# Patient Record
Sex: Female | Born: 1990 | Race: White | Hispanic: No | Marital: Single | Smoking: Never smoker
Health system: Southern US, Community
[De-identification: ages and names within clinical notes are randomized; demographics above are authoritative.]

## PROBLEM LIST (undated history)

## (undated) ENCOUNTER — Ambulatory Visit: Payer: No Typology Code available for payment source

## (undated) DIAGNOSIS — N809 Endometriosis, unspecified: Secondary | ICD-10-CM

## (undated) DIAGNOSIS — T8859XA Other complications of anesthesia, initial encounter: Secondary | ICD-10-CM

## (undated) DIAGNOSIS — K08409 Partial loss of teeth, unspecified cause, unspecified class: Secondary | ICD-10-CM

## (undated) DIAGNOSIS — T4145XA Adverse effect of unspecified anesthetic, initial encounter: Secondary | ICD-10-CM

## (undated) HISTORY — DX: Partial loss of teeth, unspecified cause, unspecified class: K08.409

## (undated) HISTORY — PX: CYST EXCISION: SHX5701

## (undated) HISTORY — PX: TONSILLECTOMY: SUR1361

## (undated) HISTORY — PX: DIAGNOSTIC LAPAROSCOPY: SUR761

## (undated) HISTORY — PX: TONSILLECTOMY AND ADENOIDECTOMY: SHX28

---
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
## 2011-12-11 ENCOUNTER — Observation Stay: Payer: Self-pay

## 2011-12-12 ENCOUNTER — Ambulatory Visit: Payer: Self-pay

## 2012-02-25 ENCOUNTER — Inpatient Hospital Stay: Payer: Self-pay

## 2012-02-25 LAB — CBC WITH DIFFERENTIAL/PLATELET
Basophil #: 0.1 10*3/uL (ref 0.0–0.1)
Basophil %: 0.5 %
Eosinophil #: 0.1 10*3/uL (ref 0.0–0.7)
HCT: 34 % — ABNORMAL LOW (ref 35.0–47.0)
Lymphocyte #: 1.9 10*3/uL (ref 1.0–3.6)
Lymphocyte %: 10.1 %
MCHC: 31.9 g/dL — ABNORMAL LOW (ref 32.0–36.0)
MCV: 91 fL (ref 80–100)
Monocyte #: 1.3 x10 3/mm — ABNORMAL HIGH (ref 0.2–0.9)
Monocyte %: 6.8 %
Neutrophil #: 15.1 10*3/uL — ABNORMAL HIGH (ref 1.4–6.5)
Platelet: 242 10*3/uL (ref 150–440)
RDW: 12.8 % (ref 11.5–14.5)
WBC: 18.4 10*3/uL — ABNORMAL HIGH (ref 3.6–11.0)

## 2012-02-26 LAB — HEMATOCRIT: HCT: 32 % — ABNORMAL LOW (ref 35.0–47.0)

## 2014-07-12 NOTE — H&P (Signed)
L&D Evaluation:  History Expanded:   HPI 24 yo G1 whose EDC = 03/17/12.  [facility].  Pt had an US at 20 weeks and repeat at 24 weeks (for anatomy) and both times placenta was not low.  Pt presents with vaginal spotting thing am, had relations last pm.  Pt found to be 1 cm externally and concern about polssible PTL.    Presents with vaginal bleeding    Patient's Medical History No Chronic Illness    Patient's Surgical History Dx scope - Osis, T+A, pilonidal cyst removal    Medications Pre Natal Vitamins    Allergies Sulfa, rash   Exam:   Vital Signs stable    General no apparent distress    Chest clear    Heart normal sinus rhythm    Abdomen gravid, non-tender    Estimated Fetal Weight Average for gestational age    Pelvic 1 cm    Mebranes Intact    FHT normal rate with no decels    Ucx absent   Impression:   Impression Poss PTl, doubt   Plan:   Comments Pros and cons discussed, will rx with beta methsone to be on the safe side.  If she continues to be without contractions will allow to go home later today and return tomorrow for repeat injection   Electronic Signatures: Towana Badgerosenow, Philip J (MD)  (Signed 09-Oct-13 16:43)  Authored: L&D Evaluation   Last Updated: 09-Oct-13 16:43 by Towana Badgerosenow, Philip J (MD)

## 2014-07-12 NOTE — H&P (Signed)
L&D Evaluation:  History:   HPI 24 year old G1P0 presents at 5937 weeks with c/o ctx's. EDD 03/17/12. [facility], negative 1st trimester screen, and LGSIL Pap. Pt is Rh Negative but significant other is also O Neg (in [employer]) so pt did not receive Rhogam.  Labs: O Negative, RI, VI, GBS POSITIVE Tdap rec'd on 01/23/12    Presents with contractions    Patient's Medical History No Chronic Illness     Patient's Surgical History none     Medications Pre Natal Vitamins     Allergies Septra, Eggs    Social History none     Family History Non-Contributory    ROS:   ROS All systems were reviewed.  HEENT, CNS, GI, GU, Respiratory, CV, Renal and Musculoskeletal systems were found to be normal.   Exam:   Vital Signs stable     Urine Protein not completed    General breathing through ctx's    Mental Status clear     Chest nl effort     Abdomen gravid, tender with contractions    Estimated Fetal Weight Average for gestational age    Edema no edema     Pelvic no external lesions, 2-3 on arrival, progressed to 7 cm quickly    Mebranes Intact    FHT normal rate with no decels    Ucx regular   Impression:   Impression active labor   Plan:   Plan EFM/NST, antibiotics for GBBS prophylaxis, anticipate SVD   Electronic Signatures: Shella MaximPutnam, Nguyet Mercer (CNM)  (Signed 24-Dec-13 03:57)  Authored: L&D Evaluation   Last Updated: 24-Dec-13 03:57 by Shella MaximPutnam, Nathalie Cavendish (CNM)

## 2015-04-10 DIAGNOSIS — H6500 Acute serous otitis media, unspecified ear: Secondary | ICD-10-CM | POA: Diagnosis not present

## 2015-08-30 ENCOUNTER — Emergency Department
Admission: EM | Admit: 2015-08-30 | Discharge: 2015-08-30 | Disposition: A | Discharge status: Home / Self Care | Payer: 59 | Attending: Emergency Medicine | Admitting: Emergency Medicine

## 2015-08-30 ENCOUNTER — Emergency Department: Payer: 59

## 2015-08-30 DIAGNOSIS — S8252XA Displaced fracture of medial malleolus of left tibia, initial encounter for closed fracture: Secondary | ICD-10-CM | POA: Insufficient documentation

## 2015-08-30 DIAGNOSIS — X509XXA Other and unspecified overexertion or strenuous movements or postures, initial encounter: Secondary | ICD-10-CM | POA: Insufficient documentation

## 2015-08-30 DIAGNOSIS — Y92828 Other wilderness area as the place of occurrence of the external cause: Secondary | ICD-10-CM | POA: Diagnosis not present

## 2015-08-30 DIAGNOSIS — Y999 Unspecified external cause status: Secondary | ICD-10-CM | POA: Insufficient documentation

## 2015-08-30 DIAGNOSIS — Y9301 Activity, walking, marching and hiking: Secondary | ICD-10-CM | POA: Diagnosis not present

## 2015-08-30 DIAGNOSIS — M25572 Pain in left ankle and joints of left foot: Secondary | ICD-10-CM | POA: Diagnosis present

## 2015-08-30 HISTORY — DX: Endometriosis, unspecified: N80.9

## 2015-08-30 MED ORDER — OXYCODONE-ACETAMINOPHEN 5-325 MG PO TABS
1.0000 | ORAL_TABLET | Freq: Once | ORAL | Status: AC
  Administered 2015-08-30: 1 via ORAL
  Filled 2015-08-30: qty 1

## 2015-08-30 MED ORDER — OXYCODONE-ACETAMINOPHEN 5-325 MG PO TABS
ORAL_TABLET | ORAL | Status: AC
  Administered 2015-08-30: 1 via ORAL
  Filled 2015-08-30: qty 1

## 2015-08-30 MED ORDER — OXYCODONE-ACETAMINOPHEN 5-325 MG PO TABS
1.0000 | ORAL_TABLET | Freq: Once | ORAL | Status: AC
Start: 2015-08-30 — End: 2015-08-30
  Administered 2015-08-30: 1 via ORAL

## 2015-08-30 MED ORDER — MELOXICAM 15 MG PO TABS: 15.0000 mg | ORAL_TABLET | Freq: Every day | ORAL | Status: DC

## 2015-08-30 MED ORDER — OXYCODONE-ACETAMINOPHEN 5-325 MG PO TABS: 1.0000 | ORAL_TABLET | Freq: Four times a day (QID) | ORAL | Status: DC | PRN

## 2015-08-30 NOTE — Discharge Instructions (Signed)
Ankle Fracture °A fracture is a break in a bone. The ankle joint is made up of three bones. These include the lower (distal) sections of your lower leg bones, called the tibia and fibula, along with a bone in your foot, called the talus. Depending on how bad the break is and if more than one ankle joint bone is broken, a cast or splint is used to protect and keep your injured bone from moving while it heals. Sometimes, surgery is required to help the fracture heal properly.  °There are two general types of fractures: °· Stable fracture. This includes a single fracture line through one bone, with no injury to ankle ligaments. A fracture of the talus that does not have any displacement (movement of the bone on either side of the fracture line) is also stable. °· Unstable fracture. This includes more than one fracture line through one or more bones in the ankle joint. It also includes fractures that have displacement of the bone on either side of the fracture line. °CAUSES °· A direct blow to the ankle.   °· Quickly and severely twisting your ankle. °· Trauma, such as a car accident or falling from a significant height. °RISK FACTORS °You may be at a higher risk of ankle fracture if: °· You have certain medical conditions. °· You are involved in high-impact sports. °· You are involved in a high-impact car accident. °SIGNS AND SYMPTOMS  °· Tender and swollen ankle. °· Bruising around the injured ankle. °· Pain on movement of the ankle. °· Difficulty walking or putting weight on the ankle. °· A cold foot below the site of the ankle injury. This can occur if the blood vessels passing through your injured ankle were also damaged. °· Numbness in the foot below the site of the ankle injury. °DIAGNOSIS  °An ankle fracture is usually diagnosed with a physical exam and X-rays. A CT scan may also be required for complex fractures. °TREATMENT  °Stable fractures are treated with a cast or splint and using crutches to avoid putting  weight on your injured ankle. This is followed by an ankle strengthening program. Some patients require a special type of cast, depending on other medical problems they may have. Unstable fractures require surgery to ensure the bones heal properly. Your health care provider will tell you what type of fracture you have and the best treatment for your condition. °HOME CARE INSTRUCTIONS  °· Review correct crutch use with your health care provider and use your crutches as directed. Safe use of crutches is extremely important. Misuse of crutches can cause you to fall or cause injury to nerves in your hands or armpits. °· Do not put weight or pressure on the injured ankle until directed by your health care provider. °· To lessen the swelling, keep the injured leg elevated while sitting or lying down. °· Apply ice to the injured area: °¨ Put ice in a plastic bag. °¨ Place a towel between your cast and the bag. °¨ Leave the ice on for 20 minutes, 2-3 times a day. °· If you have a plaster or fiberglass cast: °¨ Do not try to scratch the skin under the cast with any objects. This can increase your risk of skin infection. °¨ Check the skin around the cast every day. You may put lotion on any red or sore areas. °¨ Keep your cast dry and clean. °· If you have a plaster splint: °¨ Wear the splint as directed. °¨ You may loosen the elastic   around the splint if your toes become numb, tingle, or turn cold or blue. °· Do not put pressure on any part of your cast or splint; it may break. Rest your cast only on a pillow the first 24 hours until it is fully hardened. °· Your cast or splint can be protected during bathing with a plastic bag sealed to your skin with medical tape. Do not lower the cast or splint into water. °· Take medicines as directed by your health care provider. Only take over-the-counter or prescription medicines for pain, discomfort, or fever as directed by your health care provider. °· Do not drive a vehicle until  your health care provider specifically tells you it is safe to do so. °· If your health care provider has given you a follow-up appointment, it is very important to keep that appointment. Not keeping the appointment could result in a chronic or permanent injury, pain, and disability. If you have any problem keeping the appointment, call the facility for assistance. °SEEK MEDICAL CARE IF: °You develop increased swelling or discomfort. °SEEK IMMEDIATE MEDICAL CARE IF:  °· Your cast gets damaged or breaks. °· You have continued severe pain. °· You develop new pain or swelling after the cast was put on. °· Your skin or toenails below the injury turn blue or gray. °· Your skin or toenails below the injury feel cold, numb, or have loss of sensitivity to touch. °· There is a bad smell or pus draining from under the cast. °MAKE SURE YOU:  °· Understand these instructions. °· Will watch your condition. °· Will get help right away if you are not doing well or get worse. °  °This information is not intended to replace advice given to you by your health care provider. Make sure you discuss any questions you have with your health care provider. °  °Document Released: 02/16/2000 Document Revised: 02/23/2013 Document Reviewed: 09/17/2012 °Elsevier Interactive Patient Education ©2016 [employer] ° °Cast or Splint Care °Casts and splints support injured limbs and keep bones from moving while they heal. It is important to care for your cast or splint at home.   °HOME CARE INSTRUCTIONS °· Keep the cast or splint uncovered during the drying period. It can take 24 to 48 hours to dry if it is made of plaster. A fiberglass cast will dry in less than 1 hour. °· Do not rest the cast on anything harder than a pillow for the first 24 hours. °· Do not put weight on your injured limb or apply pressure to the cast until your health care provider gives you permission. °· Keep the cast or splint dry. Wet casts or splints can lose their shape and  may not support the limb as well. A wet cast that has lost its shape can also create harmful pressure on your skin when it dries. Also, wet skin can become infected. °¨ Cover the cast or splint with a plastic bag when bathing or when out in the rain or snow. If the cast is on the trunk of the body, take sponge baths until the cast is removed. °¨ If your cast does become wet, dry it with a towel or a blow dryer on the cool setting only. °· Keep your cast or splint clean. Soiled casts may be wiped with a moistened cloth. °· Do not place any hard or soft foreign objects under your cast or splint, such as cotton, toilet paper, lotion, or powder. °· Do not try to scratch the   skin under the cast with any object. The object could get stuck inside the cast. Also, scratching could lead to an infection. If itching is a problem, use a blow dryer on a cool setting to relieve discomfort. °· Do not trim or cut your cast or remove padding from inside of it. °· Exercise all joints next to the injury that are not immobilized by the cast or splint. For example, if you have a long leg cast, exercise the hip joint and toes. If you have an arm cast or splint, exercise the shoulder, elbow, thumb, and fingers. °· Elevate your injured arm or leg on 1 or 2 pillows for the first 1 to 3 days to decrease swelling and pain. It is best if you can comfortably elevate your cast so it is higher than your heart. °SEEK MEDICAL CARE IF:  °· Your cast or splint cracks. °· Your cast or splint is too tight or too loose. °· You have unbearable itching inside the cast. °· Your cast becomes wet or develops a soft spot or area. °· You have a bad smell coming from inside your cast. °· You get an object stuck under your cast. °· Your skin around the cast becomes red or raw. °· You have new pain or worsening pain after the cast has been applied. °SEEK IMMEDIATE MEDICAL CARE IF:  °· You have fluid leaking through the cast. °· You are unable to move your fingers  or toes. °· You have discolored (blue or white), cool, painful, or very swollen fingers or toes beyond the cast. °· You have tingling or numbness around the injured area. °· You have severe pain or pressure under the cast. °· You have any difficulty with your breathing or have shortness of breath. °· You have chest pain. °  °This information is not intended to replace advice given to you by your health care provider. Make sure you discuss any questions you have with your health care provider. °  °Document Released: 02/16/2000 Document Revised: 12/09/2012 Document Reviewed: 08/27/2012 °Elsevier Interactive Patient Education ©2016 Elsevier Inc. ° °

## 2015-08-30 NOTE — ED Provider Notes (Signed)
[facility] Emergency Department Provider Note  ____________________________________________  Time seen: Approximately 7:32 PM  I have reviewed the triage vital signs and the nursing notes.   HISTORY  Chief Complaint Ankle Pain    HPI Kimberly Lloyd is a 25 y.o. female who presents emergency department complaining of left ankle pain. Patient states that she was walking down a hill when she rolled her ankle. She felt a pop sensation. She is having swelling to bilateral aspects of the ankle and severe pain. Patient reports extreme pain with weightbearing. She reports sharp pain that is constant. She has not had any medications prior to arrival.No other injury or complaint   Past Medical History  Diagnosis Date  . Endometriosis     There are no active problems to display for this patient.   Past Surgical History  Procedure Laterality Date  . Tonsillectomy    . Tonsillectomy and adenoidectomy      Current Outpatient Rx  Name  Route  Sig  Dispense  Refill  . meloxicam (MOBIC) 15 MG tablet   Oral   Take 1 tablet (15 mg total) by mouth daily.   30 tablet   0   . oxyCODONE-acetaminophen (ROXICET) 5-325 MG tablet   Oral   Take 1 tablet by mouth every 6 (six) hours as needed for severe pain.   20 tablet   0     Allergies Septra  No family history on file.  Social History Social History  Substance Use Topics  . Smoking status: Never Smoker   . Smokeless tobacco: Never Used  . Alcohol Use: No     Review of Systems  Constitutional: No fever/chills Cardiovascular: no chest pain. Respiratory: no cough. No SOB. Gastrointestinal: No abdominal pain.  No nausea, no vomiting.   Musculoskeletal: Positive for left ankle pain Skin: Negative for rash, abrasions, lacerations, ecchymosis. Neurological: Negative for headaches, focal weakness or numbness. 10-point ROS otherwise  negative.  ____________________________________________   PHYSICAL EXAM:  VITAL SIGNS: ED Triage Vitals  Enc Vitals Group     BP --      Pulse --      Resp --      Temp --      Temp src --      SpO2 --      Weight --      Height --      Head Cir --      Peak Flow --      Pain Score --      Pain Loc --      Pain Edu? --      Excl. in GC? --      Constitutional: Alert and oriented. Well appearing and in no acute distress. Eyes: Conjunctivae are normal. PERRL. EOMI. Head: Atraumatic. Cardiovascular: Normal rate, regular rhythm. Normal S1 and S2.  Good peripheral circulation. Respiratory: Normal respiratory effort without tachypnea or retractions. Lungs CTAB. Good air entry to the bases with no decreased or absent breath sounds. Musculoskeletal: Edema is noted to the left ankle on inspection. No gross deformity noted. Patient has limited range of motion in all planes due to pain. Patient is very tender to palpation over the medial malleolus. No palpable abnormality. Dorsalis pedis pulse and sensation intact. No tenderness to palpation. Neurologic:  Normal speech and language. No gross focal neurologic deficits are appreciated.  Skin:  Skin is warm, dry and intact. No rash noted. Psychiatric: Mood and affect are normal. Speech and behavior are  normal. Patient exhibits appropriate insight and judgement.   ____________________________________________   LABS (all labs ordered are listed, but only abnormal results are displayed)  Labs Reviewed - No data to display ____________________________________________  EKG   ____________________________________________  RADIOLOGY Festus BarrenI, Tanner Yeley D Ossie Beltran, personally viewed and evaluated these images (plain radiographs) as part of my medical decision making, as well as reviewing the written report by the radiologist.  Dg Ankle Complete Left  08/30/2015  CLINICAL DATA:  Ankle pain and swelling following twisting injury today. Initial  encounter. EXAM: LEFT ANKLE COMPLETE - 3+ VIEW COMPARISON:  None. FINDINGS: The bones are adequately mineralized. There is an acute fracture involving the base of the medial malleolus which is best seen on the oblique view. This is displaced by up to 3 mm, extending to the articular surface of the tibial plafond. No other fractures are seen. There is no widening of the ankle mortise. The talus is located. Moderate associated soft tissue swelling. IMPRESSION: Oblique mildly displaced intra-articular fracture involving the base of the medial malleolus. Electronically Signed   By: Carey BullocksWilliam  Veazey M.D.   On: 08/30/2015 19:59    ____________________________________________    PROCEDURES  Procedure(s) performed:       Medications  oxyCODONE-acetaminophen (PERCOCET/ROXICET) 5-325 MG per tablet 1 tablet (not administered)     ____________________________________________   INITIAL IMPRESSION / ASSESSMENT AND PLAN / ED COURSE  Pertinent labs & imaging results that were available during my care of the patient were reviewed by me and considered in my medical decision making (see chart for details).  Patient's diagnosis is consistent with Medial malleolus fracture to the left ankle. Exam and imaging revealed the above diagnosis. Patient's ankle is splinted and she is given crutches for ambulation.. Patient will be discharged home with prescriptions for anti-inflammatories and pain medication. Patient is to follow up with orthopedics as needed or otherwise directed. Patient is given ED precautions to return to the ED for any worsening or new symptoms.     ____________________________________________  FINAL CLINICAL IMPRESSION(S) / ED DIAGNOSES  Final diagnoses:  Fractured medial malleolus, left, closed, initial encounter      NEW MEDICATIONS STARTED DURING THIS VISIT:  New Prescriptions   MELOXICAM (MOBIC) 15 MG TABLET    Take 1 tablet (15 mg total) by mouth daily.    OXYCODONE-ACETAMINOPHEN (ROXICET) 5-325 MG TABLET    Take 1 tablet by mouth every 6 (six) hours as needed for severe pain.        This chart was dictated using voice recognition software/Dragon. Despite best efforts to proofread, errors can occur which can change the meaning. Any change was purely unintentional.    Racheal PatchesJonathan D Ryland Tungate, PA-C 08/30/15 2028  Governor Rooksebecca Lord, MD 08/30/15 2206

## 2015-08-30 NOTE — ED Notes (Signed)
Patient fell this afternoon. Rolled left ankle. Heard a pop in ankle.

## 2015-08-31 DIAGNOSIS — S8252XA Displaced fracture of medial malleolus of left tibia, initial encounter for closed fracture: Secondary | ICD-10-CM | POA: Diagnosis not present

## 2015-09-01 ENCOUNTER — Encounter: Payer: Self-pay | Admitting: *Deleted

## 2015-09-01 ENCOUNTER — Inpatient Hospital Stay: Admission: RE | Admit: 2015-09-01 | Payer: 59 | Source: Ambulatory Visit

## 2015-09-07 ENCOUNTER — Ambulatory Visit
Admission: RE | Admit: 2015-09-07 | Discharge: 2015-09-07 | Disposition: A | Discharge status: Home / Self Care | Payer: 59 | Source: Ambulatory Visit | Attending: Orthopedic Surgery | Admitting: Orthopedic Surgery

## 2015-09-07 ENCOUNTER — Ambulatory Visit: Payer: 59

## 2015-09-07 ENCOUNTER — Encounter
Admission: RE | Disposition: A | Discharge status: Home / Self Care | Payer: Self-pay | Source: Ambulatory Visit | Attending: Orthopedic Surgery

## 2015-09-07 ENCOUNTER — Ambulatory Visit: Payer: 59 | Admitting: Anesthesiology

## 2015-09-07 ENCOUNTER — Encounter: Payer: Self-pay | Admitting: *Deleted

## 2015-09-07 DIAGNOSIS — Z8619 Personal history of other infectious and parasitic diseases: Secondary | ICD-10-CM | POA: Diagnosis not present

## 2015-09-07 DIAGNOSIS — X501XXA Overexertion from prolonged static or awkward postures, initial encounter: Secondary | ICD-10-CM | POA: Insufficient documentation

## 2015-09-07 DIAGNOSIS — S8256XA Nondisplaced fracture of medial malleolus of unspecified tibia, initial encounter for closed fracture: Secondary | ICD-10-CM | POA: Diagnosis not present

## 2015-09-07 DIAGNOSIS — S8252XA Displaced fracture of medial malleolus of left tibia, initial encounter for closed fracture: Secondary | ICD-10-CM | POA: Diagnosis not present

## 2015-09-07 DIAGNOSIS — Z882 Allergy status to sulfonamides status: Secondary | ICD-10-CM | POA: Diagnosis not present

## 2015-09-07 DIAGNOSIS — Z79891 Long term (current) use of opiate analgesic: Secondary | ICD-10-CM | POA: Insufficient documentation

## 2015-09-07 DIAGNOSIS — Z9889 Other specified postprocedural states: Secondary | ICD-10-CM | POA: Insufficient documentation

## 2015-09-07 DIAGNOSIS — Z8781 Personal history of (healed) traumatic fracture: Secondary | ICD-10-CM

## 2015-09-07 HISTORY — DX: Other complications of anesthesia, initial encounter: T88.59XA

## 2015-09-07 HISTORY — PX: ORIF ANKLE FRACTURE: SHX5408

## 2015-09-07 HISTORY — DX: Adverse effect of unspecified anesthetic, initial encounter: T41.45XA

## 2015-09-07 LAB — POCT PREGNANCY, URINE: Preg Test, Ur: NEGATIVE

## 2015-09-07 SURGERY — OPEN REDUCTION INTERNAL FIXATION (ORIF) ANKLE FRACTURE
Anesthesia: General | Laterality: Left | Wound class: Clean

## 2015-09-07 MED ORDER — ONDANSETRON HCL 4 MG/2ML IJ SOLN: 4.0000 mg | Freq: Once | INTRAMUSCULAR | Status: DC | PRN

## 2015-09-07 MED ORDER — FENTANYL CITRATE (PF) 100 MCG/2ML IJ SOLN
INTRAMUSCULAR | Status: AC
  Administered 2015-09-07: 25 ug via INTRAVENOUS
  Filled 2015-09-07: qty 2

## 2015-09-07 MED ORDER — KETOROLAC TROMETHAMINE 30 MG/ML IJ SOLN
INTRAMUSCULAR | Status: DC | PRN
  Administered 2015-09-07: 30 mg via INTRAVENOUS

## 2015-09-07 MED ORDER — MIDAZOLAM HCL 2 MG/2ML IJ SOLN
INTRAMUSCULAR | Status: DC | PRN
  Administered 2015-09-07: 2 mg via INTRAVENOUS

## 2015-09-07 MED ORDER — PROPOFOL 10 MG/ML IV BOLUS
INTRAVENOUS | Status: DC | PRN
  Administered 2015-09-07: 160 mg via INTRAVENOUS

## 2015-09-07 MED ORDER — LACTATED RINGERS IV SOLN
INTRAVENOUS | Status: DC
  Administered 2015-09-07: 09:00:00 via INTRAVENOUS

## 2015-09-07 MED ORDER — OXYCODONE-ACETAMINOPHEN 5-325 MG PO TABS: 1.0000 | ORAL_TABLET | ORAL | Status: DC | PRN

## 2015-09-07 MED ORDER — KETAMINE HCL 50 MG/ML IJ SOLN
INTRAMUSCULAR | Status: DC | PRN
  Administered 2015-09-07: 40 mg via INTRAVENOUS

## 2015-09-07 MED ORDER — ONDANSETRON HCL 4 MG PO TABS
4.0000 mg | ORAL_TABLET | Freq: Four times a day (QID) | ORAL | Status: DC | PRN
Start: 2015-09-07 — End: 2015-09-07

## 2015-09-07 MED ORDER — FENTANYL CITRATE (PF) 100 MCG/2ML IJ SOLN
INTRAMUSCULAR | Status: DC | PRN
  Administered 2015-09-07: 100 ug via INTRAVENOUS
  Administered 2015-09-07: 150 ug via INTRAVENOUS

## 2015-09-07 MED ORDER — NALOXONE HCL 0.4 MG/ML IJ SOLN
INTRAMUSCULAR | Status: DC | PRN
  Administered 2015-09-07: 80 ug via INTRAVENOUS

## 2015-09-07 MED ORDER — HYDROMORPHONE HCL 1 MG/ML IJ SOLN
INTRAMUSCULAR | Status: AC
  Administered 2015-09-07: 0.25 mg via INTRAVENOUS
  Filled 2015-09-07: qty 1

## 2015-09-07 MED ORDER — FENTANYL CITRATE (PF) 100 MCG/2ML IJ SOLN
25.0000 ug | INTRAMUSCULAR | Status: DC | PRN
  Administered 2015-09-07 (×4): 25 ug via INTRAVENOUS

## 2015-09-07 MED ORDER — HYDROMORPHONE HCL 1 MG/ML IJ SOLN
0.2500 mg | INTRAMUSCULAR | Status: DC | PRN
  Administered 2015-09-07 (×7): 0.25 mg via INTRAVENOUS

## 2015-09-07 MED ORDER — NEOMYCIN-POLYMYXIN B GU 40-200000 IR SOLN
Status: AC
  Filled 2015-09-07: qty 4

## 2015-09-07 MED ORDER — OXYCODONE-ACETAMINOPHEN 5-325 MG PO TABS
1.0000 | ORAL_TABLET | ORAL | Status: DC | PRN
  Administered 2015-09-07: 1 via ORAL

## 2015-09-07 MED ORDER — EPHEDRINE SULFATE 50 MG/ML IJ SOLN
INTRAMUSCULAR | Status: DC | PRN
  Administered 2015-09-07: 5 mg via INTRAVENOUS

## 2015-09-07 MED ORDER — METOCLOPRAMIDE HCL 10 MG PO TABS: 5.0000 mg | ORAL_TABLET | Freq: Three times a day (TID) | ORAL | Status: DC | PRN

## 2015-09-07 MED ORDER — LIDOCAINE HCL (CARDIAC) 20 MG/ML IV SOLN
INTRAVENOUS | Status: DC | PRN
  Administered 2015-09-07: 100 mg via INTRAVENOUS

## 2015-09-07 MED ORDER — FAMOTIDINE 20 MG PO TABS
ORAL_TABLET | ORAL | Status: AC
Start: 2015-09-07 — End: 2015-09-07
  Administered 2015-09-07: 20 mg via ORAL
  Filled 2015-09-07: qty 1

## 2015-09-07 MED ORDER — ONDANSETRON HCL 4 MG/2ML IJ SOLN
INTRAMUSCULAR | Status: DC | PRN
  Administered 2015-09-07: 4 mg via INTRAVENOUS

## 2015-09-07 MED ORDER — CEFAZOLIN SODIUM-DEXTROSE 2-4 GM/100ML-% IV SOLN
2.0000 g | Freq: Once | INTRAVENOUS | Status: AC
  Administered 2015-09-07: 2 g via INTRAVENOUS

## 2015-09-07 MED ORDER — SODIUM CHLORIDE 0.9 % IV SOLN: INTRAVENOUS | Status: DC

## 2015-09-07 MED ORDER — DEXAMETHASONE SODIUM PHOSPHATE 4 MG/ML IJ SOLN
INTRAMUSCULAR | Status: DC | PRN
  Administered 2015-09-07: 5 mg via INTRAVENOUS

## 2015-09-07 MED ORDER — OXYCODONE-ACETAMINOPHEN 5-325 MG PO TABS: 1.0000 | ORAL_TABLET | Freq: Four times a day (QID) | ORAL | Status: DC | PRN

## 2015-09-07 MED ORDER — GLYCOPYRROLATE 0.2 MG/ML IJ SOLN
INTRAMUSCULAR | Status: DC | PRN
  Administered 2015-09-07: 0.2 mg via INTRAVENOUS

## 2015-09-07 MED ORDER — CEFAZOLIN SODIUM-DEXTROSE 2-4 GM/100ML-% IV SOLN
INTRAVENOUS | Status: AC
  Filled 2015-09-07: qty 100

## 2015-09-07 MED ORDER — METOCLOPRAMIDE HCL 5 MG/ML IJ SOLN: 5.0000 mg | Freq: Three times a day (TID) | INTRAMUSCULAR | Status: DC | PRN

## 2015-09-07 MED ORDER — FAMOTIDINE 20 MG PO TABS
20.0000 mg | ORAL_TABLET | Freq: Once | ORAL | Status: AC
  Administered 2015-09-07: 20 mg via ORAL

## 2015-09-07 MED ORDER — OXYCODONE-ACETAMINOPHEN 5-325 MG PO TABS
ORAL_TABLET | ORAL | Status: AC
  Filled 2015-09-07: qty 1

## 2015-09-07 MED ORDER — ONDANSETRON HCL 4 MG/2ML IJ SOLN: 4.0000 mg | Freq: Four times a day (QID) | INTRAMUSCULAR | Status: DC | PRN

## 2015-09-07 SURGICAL SUPPLY — 50 items
BANDAGE ACE 4X5 VEL STRL LF (GAUZE/BANDAGES/DRESSINGS) ×4 IMPLANT
BIT DRILL 2.9 CANN QC NONSTRL (BIT) ×2 IMPLANT
BLADE SURG SZ10 CARB STEEL (BLADE) ×4 IMPLANT
BNDG ESMARK 4X12 TAN STRL LF (GAUZE/BANDAGES/DRESSINGS) ×2 IMPLANT
CANISTER SUCT 1200ML W/VALVE (MISCELLANEOUS) ×2 IMPLANT
CHLORAPREP W/TINT 26ML (MISCELLANEOUS) ×2 IMPLANT
CUFF TOURN 24 STER (MISCELLANEOUS) IMPLANT
CUFF TOURN 30 STER DUAL PORT (MISCELLANEOUS) ×2 IMPLANT
DRAPE FLUOR MINI C-ARM 54X84 (DRAPES) ×2 IMPLANT
DRAPE INCISE IOBAN 66X45 STRL (DRAPES) ×2 IMPLANT
DRAPE U-SHAPE 47X51 STRL (DRAPES) ×2 IMPLANT
DRSG EMULSION OIL 3X8 NADH (GAUZE/BANDAGES/DRESSINGS) ×2 IMPLANT
ELECT CAUTERY BLADE 6.4 (BLADE) ×2 IMPLANT
ELECT REM PT RETURN 9FT ADLT (ELECTROSURGICAL) ×2
ELECTRODE REM PT RTRN 9FT ADLT (ELECTROSURGICAL) ×1 IMPLANT
GAUZE PETRO XEROFOAM 1X8 (MISCELLANEOUS) ×2 IMPLANT
GAUZE SPONGE 4X4 12PLY STRL (GAUZE/BANDAGES/DRESSINGS) ×2 IMPLANT
GLOVE BIOGEL PI IND STRL 9 (GLOVE) ×1 IMPLANT
GLOVE BIOGEL PI INDICATOR 9 (GLOVE) ×1
GLOVE INDICATOR 7.5 STRL GRN (GLOVE) ×2 IMPLANT
GLOVE SURG ORTHO 9.0 STRL STRW (GLOVE) ×2 IMPLANT
GOWN STRL REUS W/ TWL LRG LVL3 (GOWN DISPOSABLE) ×1 IMPLANT
GOWN STRL REUS W/TWL LRG LVL3 (GOWN DISPOSABLE) ×1
GOWN SURG XXL (GOWNS) ×2 IMPLANT
HEMOVAC 400ML (MISCELLANEOUS)
K-WIRE ACE 1.6X6 (WIRE) ×2
KIT DRAIN HEMOVAC JP 7FR 400ML (MISCELLANEOUS) IMPLANT
KIT RM TURNOVER STRD PROC AR (KITS) ×2 IMPLANT
KWIRE ACE 1.6X6 (WIRE) ×1 IMPLANT
LABEL OR SOLS (LABEL) ×2 IMPLANT
NS IRRIG 1000ML POUR BTL (IV SOLUTION) ×2 IMPLANT
PACK EXTREMITY ARMC (MISCELLANEOUS) ×2 IMPLANT
PAD ABD DERMACEA PRESS 5X9 (GAUZE/BANDAGES/DRESSINGS) ×4 IMPLANT
PAD CAST CTTN 4X4 STRL (SOFTGOODS) ×2 IMPLANT
PAD PREP 24X41 OB/GYN DISP (PERSONAL CARE ITEMS) ×2 IMPLANT
PADDING CAST COTTON 4X4 STRL (SOFTGOODS) ×2
SCREW ACE CAN 4.0 36M (Screw) ×4 IMPLANT
SPLINT CAST 1 STEP 5X30 WHT (MISCELLANEOUS) ×2 IMPLANT
SPONGE LAP 18X18 5 PK (GAUZE/BANDAGES/DRESSINGS) ×2 IMPLANT
STAPLER SKIN PROX 35W (STAPLE) ×2 IMPLANT
STOCKINETTE STRL 6IN 960660 (GAUZE/BANDAGES/DRESSINGS) ×2 IMPLANT
SUT ETHILON 3-0 FS-10 30 BLK (SUTURE) ×2
SUT MNCRL AB 4-0 PS2 18 (SUTURE) ×4 IMPLANT
SUT VIC AB 0 CT1 36 (SUTURE) ×2 IMPLANT
SUT VIC AB 2-0 SH 27 (SUTURE) ×1
SUT VIC AB 2-0 SH 27XBRD (SUTURE) ×1 IMPLANT
SUT VIC AB 3-0 SH 27 (SUTURE) ×1
SUT VIC AB 3-0 SH 27X BRD (SUTURE) ×1 IMPLANT
SUTURE EHLN 3-0 FS-10 30 BLK (SUTURE) ×1 IMPLANT
SYRINGE 10CC LL (SYRINGE) ×2 IMPLANT

## 2015-09-07 NOTE — Op Note (Signed)
09/07/2015  11:03 AM  PATIENT:  Kimberly Lloyd  25 y.o. female  PRE-OPERATIVE DIAGNOSIS:  CLOSED DISPLACED OF MEDIAL MALLEOLUS OF LEFT TIBIA  POST-OPERATIVE DIAGNOSIS:  CLOSED DISPLACED OF MEDIAL MALLEOLUS OF LEFT TIBIA  PROCEDURE:  Procedure(s): OPEN REDUCTION INTERNAL FIXATION (ORIF) ANKLE FRACTURE (Left)  SURGEON: Laurene Footman, MD  ASSISTANTS: None  ANESTHESIA:   general  EBL:  Total I/O In: 700 [I.V.:700] Out: 1 [Blood:1]  BLOOD ADMINISTERED:none  DRAINS: none   LOCAL MEDICATIONS USED:  NONE  SPECIMEN:  No Specimen  DISPOSITION OF SPECIMEN:  N/A  COUNTS:  YES  TOURNIQUET:   18 minutes at 300 mmHg  IMPLANTS: 4.0 cannulated screws 2  DICTATION: .Dragon Dictation patient brought the operating room and after adequate anesthesia was obtained left leg was prepped and draped in sterile fashion. After patient identification and timeout procedure were completed, tourniquet was raised to 3 mmHg. An anteromedial incision was made over the medial malleolus and the met malleolar fracture exposed, and the fracture hematoma irrigated out. The fracture is held in reduced position and the guidewire for the cannulated screw was passed into the distal tibia. A second K wire was placed and there was anatomic alignment. Measurement was made followed by countersinking after having drilled and then 2 36 mm cannulated screws were placed with good compression at the fracture site under direct visualization. Guidewires were removed mini C-arm was used during the case and there appeared to be anatomic alignment with the screws going up along the medial aspect of the distal tibia but staying within the bone. The wound was irrigated and closed with 2-0 Vicryl and skin staples. Xeroform 4 x 4 web roll and a stirrup splint applied  PLAN OF CARE: Discharge to home after PACU  PATIENT DISPOSITION:  PACU - hemodynamically stable.

## 2015-09-07 NOTE — Anesthesia Preprocedure Evaluation (Signed)
Anesthesia Evaluation  Patient identified by MRN, date of birth, ID band Patient awake  General Assessment Comment:Patient states she gets aggressive after surgery  History of Anesthesia Complications (+) history of anesthetic complications  Airway Mallampati: II  TM Distance: >3 FB Neck ROM: Full    Dental no notable dental hx.  Patient has permanent retainer behind front top teeth:   Pulmonary neg pulmonary ROS,    Pulmonary exam normal        Cardiovascular negative cardio ROS Normal cardiovascular exam     Neuro/Psych negative neurological ROS  negative psych ROS   GI/Hepatic negative GI ROS, Neg liver ROS,   Endo/Other  negative endocrine ROS  Renal/GU negative Renal ROS  negative genitourinary   Musculoskeletal negative musculoskeletal ROS (+)   Abdominal Normal abdominal exam  (+)   Peds negative pediatric ROS (+)  Hematology negative hematology ROS (+)   Anesthesia Other Findings   Reproductive/Obstetrics negative OB ROS                             Anesthesia Physical Anesthesia Plan  ASA: I  Anesthesia Plan: General   Post-op Pain Management:    Induction: Intravenous  Airway Management Planned: LMA  Additional Equipment:   Intra-op Plan:   Post-operative Plan: Extubation in OR  Informed Consent: I have reviewed the patients History and Physical, chart, labs and discussed the procedure including the risks, benefits and alternatives for the proposed anesthesia with the patient or authorized representative who has indicated his/her understanding and acceptance.   Dental advisory given  Plan Discussed with: CRNA and Surgeon  Anesthesia Plan Comments:         Anesthesia Quick Evaluation

## 2015-09-07 NOTE — Progress Notes (Signed)
Elevated ankle on pillows left and applied ice pack

## 2015-09-07 NOTE — H&P (Signed)
Reviewed paper H+P, will be scanned into chart. No changes noted.  

## 2015-09-07 NOTE — Anesthesia Procedure Notes (Signed)
Procedure Name: LMA Insertion Date/Time: 09/07/2015 10:21 AM Performed by: Shirlee LimerickMARION, Mendell Bontempo Pre-anesthesia Checklist: Patient identified, Emergency Drugs available, Suction available and Patient being monitored Patient Re-evaluated:Patient Re-evaluated prior to inductionOxygen Delivery Method: Circle system utilized Preoxygenation: Pre-oxygenation with 100% oxygen Intubation Type: IV induction LMA Size: 4.0 Number of attempts: 1 Placement Confirmation: breath sounds checked- equal and bilateral

## 2015-09-07 NOTE — Transfer of Care (Signed)
Immediate Anesthesia Transfer of Care Note  Patient: Kimberly Lloyd  Procedure(s) Performed: Procedure(s): OPEN REDUCTION INTERNAL FIXATION (ORIF) ANKLE FRACTURE (Left)  Patient Location: PACU  Anesthesia Type:General  Level of Consciousness: awake and patient cooperative  Airway & Oxygen Therapy: Patient Spontanous Breathing and Patient connected to nasal cannula oxygen  Post-op Assessment: Report given to RN and Post -op Vital signs reviewed and stable  Post vital signs: Reviewed and stable  Last Vitals:  Filed Vitals:   09/07/15 0846 09/07/15 0911  BP: 126/92 113/62  Pulse: 58   Temp: 36.5 C   Resp: 16     Last Pain:  Filed Vitals:   09/07/15 0911  PainSc: 3          Complications: No apparent anesthesia complications

## 2015-09-07 NOTE — Progress Notes (Signed)
Heart rate mid 40's at times especial when asleep    No new orders from dr Noralyn Pickcarroll   Blood pressure 113/73

## 2015-09-07 NOTE — Discharge Instructions (Signed)
AMBULATORY SURGERY  DISCHARGE INSTRUCTIONS   1) The drugs that you were given will stay in your system until tomorrow so for the next 24 hours you should not:  A) Drive an automobile B) Make any legal decisions C) Drink any alcoholic beverage   2) You may resume regular meals tomorrow.  Today it is better to start with liquids and gradually work up to solid foods.  You may eat anything you prefer, but it is better to start with liquids, then soup and crackers, and gradually work up to solid foods.   3) Please notify your doctor immediately if you have any unusual bleeding, trouble breathing, redness and pain at the surgery site, drainage, fever, or pain not relieved by medication.    4) Additional Instructions:  Ice and elevation   Keep dressing clean and dry' Non weight bearing   Please contact your physician with any problems or Same Day Surgery at [phone], Monday through Friday 6 am to 4 pm, or [place] at [facility] Main number at [phone].

## 2015-09-09 NOTE — Anesthesia Postprocedure Evaluation (Signed)
Anesthesia Post Note  Patient: Kimberly Lloyd  Procedure(s) Performed: Procedure(s) (LRB): OPEN REDUCTION INTERNAL FIXATION (ORIF) ANKLE FRACTURE (Left)  Patient location during evaluation: PACU Anesthesia Type: General Level of consciousness: awake and alert and oriented Pain management: pain level controlled Vital Signs Assessment: post-procedure vital signs reviewed and stable Respiratory status: spontaneous breathing Cardiovascular status: blood pressure returned to baseline Anesthetic complications: no    Last Vitals:  Filed Vitals:   09/07/15 1230 09/07/15 1404  BP: 117/74 116/74  Pulse: 48 64  Temp: 36.7 C   Resp: 16 16    Last Pain:  Filed Vitals:   09/07/15 1405  PainSc: 4                  Jamiee Milholland

## 2015-09-11 DIAGNOSIS — Z8781 Personal history of (healed) traumatic fracture: Secondary | ICD-10-CM | POA: Diagnosis not present

## 2015-09-11 DIAGNOSIS — Z967 Presence of other bone and tendon implants: Secondary | ICD-10-CM | POA: Diagnosis not present

## 2015-09-21 DIAGNOSIS — Z8781 Personal history of (healed) traumatic fracture: Secondary | ICD-10-CM | POA: Diagnosis not present

## 2015-09-21 DIAGNOSIS — Z967 Presence of other bone and tendon implants: Secondary | ICD-10-CM | POA: Diagnosis not present

## 2015-10-03 DIAGNOSIS — B001 Herpesviral vesicular dermatitis: Secondary | ICD-10-CM | POA: Diagnosis not present

## 2015-10-06 DIAGNOSIS — Z8781 Personal history of (healed) traumatic fracture: Secondary | ICD-10-CM | POA: Diagnosis not present

## 2015-10-06 DIAGNOSIS — Z967 Presence of other bone and tendon implants: Secondary | ICD-10-CM | POA: Diagnosis not present

## 2015-11-08 DIAGNOSIS — Z8781 Personal history of (healed) traumatic fracture: Secondary | ICD-10-CM | POA: Diagnosis not present

## 2015-11-08 DIAGNOSIS — Z967 Presence of other bone and tendon implants: Secondary | ICD-10-CM | POA: Diagnosis not present

## 2015-12-06 DIAGNOSIS — R87610 Atypical squamous cells of undetermined significance on cytologic smear of cervix (ASC-US): Secondary | ICD-10-CM | POA: Diagnosis not present

## 2015-12-06 DIAGNOSIS — Z01419 Encounter for gynecological examination (general) (routine) without abnormal findings: Secondary | ICD-10-CM | POA: Diagnosis not present

## 2015-12-06 DIAGNOSIS — Z113 Encounter for screening for infections with a predominantly sexual mode of transmission: Secondary | ICD-10-CM | POA: Diagnosis not present

## 2016-03-27 DIAGNOSIS — J069 Acute upper respiratory infection, unspecified: Secondary | ICD-10-CM | POA: Diagnosis not present

## 2016-03-27 DIAGNOSIS — R05 Cough: Secondary | ICD-10-CM | POA: Diagnosis not present

## 2016-05-09 ENCOUNTER — Other Ambulatory Visit: Payer: Self-pay | Admitting: Obstetrics and Gynecology

## 2016-06-28 ENCOUNTER — Telehealth: Payer: Self-pay

## 2016-06-28 ENCOUNTER — Other Ambulatory Visit: Payer: Self-pay | Admitting: Obstetrics and Gynecology

## 2016-06-28 MED ORDER — NORGESTIMATE-ETH ESTRADIOL 0.25-35 MG-MCG PO TABS: 1.0000 | ORAL_TABLET | Freq: Every day | ORAL | 11 refills | Status: DC

## 2016-06-28 NOTE — Telephone Encounter (Signed)
Pt calling.  Started a bc in 11/2015.  For the last 91m period has started 1wk early even though she's been taking it correctly.  Also, the periods are getting heavier.  She is wondering if you could start her on something diff. Or if this is normal.  Please call.  [phone]

## 2016-06-28 NOTE — Telephone Encounter (Signed)
Please advise 

## 2016-06-28 NOTE — Telephone Encounter (Signed)
Switch to ortho-cyclen patient aware you can close out task

## 2016-08-13 DIAGNOSIS — R05 Cough: Secondary | ICD-10-CM | POA: Diagnosis not present

## 2016-08-13 DIAGNOSIS — J020 Streptococcal pharyngitis: Secondary | ICD-10-CM | POA: Diagnosis not present

## 2016-10-14 ENCOUNTER — Telehealth: Payer: Self-pay

## 2016-10-14 NOTE — Telephone Encounter (Signed)
Pt has been on sprintec for the past 3 months. The first two months were fine but patient has started having breakthrough bleeding and heavy bleeding since Friday 8/10 to where she is bleeding through clothes. Per her pill pack she is not supposed to start her cycle for another week. Pt states you mentioned needing and u/s for possibly polyp. Please advise. Thank you. Cb# [phone]

## 2016-10-15 ENCOUNTER — Other Ambulatory Visit: Payer: Self-pay | Admitting: Obstetrics and Gynecology

## 2016-10-15 DIAGNOSIS — N939 Abnormal uterine and vaginal bleeding, unspecified: Secondary | ICD-10-CM

## 2016-10-15 NOTE — Telephone Encounter (Signed)
Left msg for pt that she will be receiving a call from front desk to schedule appt.

## 2016-10-15 NOTE — Telephone Encounter (Signed)
Message sent to front desk to book ultrasound and follow up

## 2016-10-16 NOTE — Telephone Encounter (Signed)
Pt scheduled for 11/05/16

## 2016-11-01 DIAGNOSIS — M67439 Ganglion, unspecified wrist: Secondary | ICD-10-CM | POA: Diagnosis not present

## 2016-11-05 ENCOUNTER — Ambulatory Visit (INDEPENDENT_AMBULATORY_CARE_PROVIDER_SITE_OTHER): Payer: 59

## 2016-11-05 ENCOUNTER — Encounter: Payer: Self-pay | Admitting: Obstetrics and Gynecology

## 2016-11-05 ENCOUNTER — Ambulatory Visit (INDEPENDENT_AMBULATORY_CARE_PROVIDER_SITE_OTHER): Payer: 59 | Admitting: Obstetrics and Gynecology

## 2016-11-05 VITALS — BP 122/72 | Wt 181.0 lb

## 2016-11-05 DIAGNOSIS — N939 Abnormal uterine and vaginal bleeding, unspecified: Secondary | ICD-10-CM

## 2016-11-05 NOTE — Progress Notes (Signed)
Gynecology Abnormal Uterine Bleeding Initial Evaluation   Chief Complaint:  Chief Complaint  Patient presents with  . GYn U/S follow up    History of Present Illness:    Paitient is a 26 y.o. No obstetric history on file. who LMP was Patient's last menstrual period was 10/22/2016., presents today for a problem visit.  She complains of metrorrhagia (no pattern) that  began in the past 6 months (although has prior history of irregular bleeding) and its severity is described as moderate.   She has used the following for attempts at control: trialed MIrena (did well as far as cycle control but dypsarunia), lo loestrin FE (initially did well but then developed breakthough bleedig), now sprintec also having breakthough bleeding.. The patient is not sexually active.  Previous evaluation: ultrasound showing structurally normal uterus today, normal endometrial stripe, no free fluid.  Small endometrioma is noted on left ovary 2.04 x 0.8 cm. Prior Diagnosis: endometriosis.  No issues with bleeding during dental procedures, easy bruising, or hemorrhage with her prior deliveries.  Pap 12/06/2015 ASCUS HPV negative   Review of Systems: Review of Systems  Constitutional: Negative for chills and fever.  HENT: Negative for congestion.   Respiratory: Negative for cough and shortness of breath.   Cardiovascular: Negative for chest pain and palpitations.  Gastrointestinal: Negative for abdominal pain, constipation, diarrhea, heartburn, nausea and vomiting.  Genitourinary: Negative for dysuria, frequency and urgency.  Skin: Negative for itching and rash.  Neurological: Negative for dizziness and headaches.  Endo/Heme/Allergies: Negative for polydipsia.  Psychiatric/Behavioral: Negative for depression.    Past Medical History:  Past Medical History:  Diagnosis Date  . Complication of anesthesia    GETS AGGRESSIVE   . Endometriosis     Past Surgical History:  Past Surgical History:  Procedure  Laterality Date  . CYST EXCISION    . DIAGNOSTIC LAPAROSCOPY    . ORIF ANKLE FRACTURE Left 09/07/2015   Procedure: OPEN REDUCTION INTERNAL FIXATION (ORIF) ANKLE FRACTURE;  Surgeon: Kennedy Bucker, MD;  Location: [facility] ORS;  Service: Orthopedics;  Laterality: Left;  . TONSILLECTOMY    . TONSILLECTOMY AND ADENOIDECTOMY      Obstetric History: No obstetric history on file.  Family History:  No family history on file.  Social History:  Social History   Social History  . Marital status: Single    Spouse name: N/A  . Number of children: N/A  . Years of education: N/A   Occupational History  . Not on file.   Social History Main Topics  . Smoking status: Never Smoker  . Smokeless tobacco: Never Used  . Alcohol use No  . Drug use: No  . Sexual activity: Not on file   Other Topics Concern  . Not on file   Social History Narrative  . No narrative on file    Allergies:  Allergies  Allergen Reactions  . Septra [Sulfamethoxazole-Trimethoprim] Rash    Medications: Prior to Admission medications   Medication Sig Start Date End Date Taking? Authorizing Provider  norgestimate-ethinyl estradiol (ORTHO-CYCLEN,SPRINTEC,PREVIFEM) 0.25-35 MG-MCG tablet Take 1 tablet by mouth daily. 06/28/16   Vena Austria, MD    Physical Exam Vitals:  Vitals:   11/05/16 1049  BP: 122/72   Patient's last menstrual period was 10/22/2016.  General: NAD HEENT: normocephalic, anicteric Pulmonary: No increased work of breathing Extremities: no edema, erythema, or tenderness Neurologic: Grossly intact Psychiatric: mood appropriate, affect full  Assessment: 26 y.o.  with abnormal uterine bleeding  Plan: Problem List  Items Addressed This Visit    None    Visit Diagnoses    Abnormal uterine bleeding    -  Primary   Relevant Orders   Von Willebrand panel   TSH   Prolactin       1) Discussed management options for abnormal uterine bleeding including expectant, NSAIDs, tranexamic acid  (Lysteda), oral progesterone (Provera, norethindrone, megace), Depo Provera, Levonorgestrel containing IUD, endometrial ablation (Novasure) or hysterectomy as definitive surgical management.  Discussed risks and benefits of each method.   Final management decision will hinge on results of patient's work up and whether an underlying etiology for the patients bleeding symptoms can be discerned.  We will conduct a basic work up examining using the PALM-COIEN classification system.  In the meantime the patient opts to trial of continued OCP while we await results of her ultrasound and labs.  Printed patient education handouts were given to the patient to review at home.  Bleeding precautions reviewed.   - plans to go back to IUD call with next cycle to schedule and concurrent annual exam - mom increased surveillance breast cancer reportedly negative testing for carrier status but asked to fill out family history with mom with attention paid to age of onset

## 2016-11-05 NOTE — Patient Instructions (Signed)
Abnormal Uterine Bleeding Abnormal uterine bleeding can affect women at various stages in life, including teenagers, women in their reproductive years, pregnant women, and women who have reached menopause. Several kinds of uterine bleeding are considered abnormal, including:  Bleeding or spotting between periods.  Bleeding after sexual intercourse.  Bleeding that is heavier or more than normal.  Periods that last longer than usual.  Bleeding after menopause. Many cases of abnormal uterine bleeding are minor and simple to treat, while others are more serious. Any type of abnormal bleeding should be evaluated by your health care provider. Treatment will depend on the cause of the bleeding. Follow these instructions at home: Monitor your condition for any changes. The following actions may help to alleviate any discomfort you are experiencing:  Avoid the use of tampons and douches as directed by your health care provider.  Change your pads frequently. You should get regular pelvic exams and Pap tests. Keep all follow-up appointments for diagnostic tests as directed by your health care provider. Contact a health care provider if:  Your bleeding lasts more than 1 week.  You feel dizzy at times. Get help right away if:  You pass out.  You are changing pads every 15 to 30 minutes.  You have abdominal pain.  You have a fever.  You become sweaty or weak.  You are passing large blood clots from the vagina.  You start to feel nauseous and vomit. This information is not intended to replace advice given to you by your health care provider. Make sure you discuss any questions you have with your health care provider. Document Released: 02/18/2005 Document Revised: 08/02/2015 Document Reviewed: 09/17/2012 Elsevier Interactive Patient Education  2017 Elsevier Inc.  

## 2016-11-06 LAB — VON WILLEBRAND PANEL
Factor VIII Activity: 91 % (ref 57–163)
Von Willebrand Ag: 83 % (ref 50–200)
Von Willebrand Factor: 59 % (ref 50–200)

## 2016-11-06 LAB — COAG STUDIES INTERP REPORT

## 2016-11-06 LAB — TSH: TSH: 1.89 u[IU]/mL (ref 0.450–4.500)

## 2016-11-06 LAB — PROLACTIN: Prolactin: 9.6 ng/mL (ref 4.8–23.3)

## 2016-11-08 ENCOUNTER — Encounter: Payer: Self-pay | Admitting: Obstetrics and Gynecology

## 2016-11-19 DIAGNOSIS — H5203 Hypermetropia, bilateral: Secondary | ICD-10-CM | POA: Diagnosis not present

## 2017-01-13 ENCOUNTER — Telehealth: Payer: Self-pay | Admitting: Obstetrics and Gynecology

## 2017-01-13 NOTE — Telephone Encounter (Signed)
Pt us calling to schedule IUD insertion. Pt doesn't removal name of device and has started her menstrual cycle today. Please advise overbook for AMS and which devise pt is suppose to have.

## 2017-01-13 NOTE — Telephone Encounter (Signed)
Called and left voicemail for patient to call back to confirmed schedule appt 01/16/17 with [facility] for Mirena insertion

## 2017-01-13 NOTE — Telephone Encounter (Signed)
Pt can be scheduled within the next 10 days, does not need to be overbooked unless she doesn't mind another provider. Then schedule her at next available with another provider. Let Crystal know Mirena. KJ CMA

## 2017-01-16 ENCOUNTER — Ambulatory Visit (INDEPENDENT_AMBULATORY_CARE_PROVIDER_SITE_OTHER): Payer: 59 | Admitting: Obstetrics and Gynecology

## 2017-01-16 ENCOUNTER — Encounter: Payer: Self-pay | Admitting: Obstetrics and Gynecology

## 2017-01-16 VITALS — BP 106/60 | HR 69 | Ht 65.0 in | Wt 173.0 lb

## 2017-01-16 DIAGNOSIS — Z30430 Encounter for insertion of intrauterine contraceptive device: Secondary | ICD-10-CM

## 2017-01-16 MED ORDER — LEVONORGESTREL 20 MCG/24HR IU IUD: 1.0000 | INTRAUTERINE_SYSTEM | Freq: Once | INTRAUTERINE | 0 refills | Status: DC

## 2017-01-16 NOTE — Telephone Encounter (Signed)
Mirena stock reserved for this patient. 

## 2017-01-16 NOTE — Progress Notes (Signed)
   Chief Complaint  Patient presents with  . Contraception    Mirena insert     IUD PROCEDURE NOTE:  Kimberly Lloyd is a 26 y.o. No obstetric history on file. here for Mirena  IUD insertion for irregular bleeding. Neg eval with Dr. Bonney AidStaebler.  Had IUD in the past. Cycle control not achieved with OCPs. Pt is not sex active.  Annual due.   BP 106/60 (BP Location: Left Arm, Patient Position: Sitting, Cuff Size: Normal)   Pulse 69   Ht 5\' 5"  (1.651 m)   Wt 173 lb (78.5 kg)   LMP 01/12/2017   BMI 28.79 kg/m   IUD Insertion Procedure Note Patient identified, informed consent performed, consent signed.   Discussed risks of irregular bleeding, cramping, infection, malpositioning or misplacement of the IUD outside the uterus which may require further procedure such as laparoscopy, risk of failure <1%. Time out was performed.    Speculum placed in the vagina.  Cervix visualized.  Cleaned with Betadine x 2.  Grasped anteriorly with a single tooth tenaculum.  Uterus sounded to 7.0 cm.   IUD placed per manufacturer's recommendations.  Strings trimmed to 3 cm. Tenaculum was removed, good hemostasis noted.  Patient tolerated procedure well.   ASSESSMENT:  Encounter for IUD insertion - Plan: levonorgestrel (MIRENA, 52 MG,) 20 MCG/24HR IUD   Meds ordered this encounter  Medications  . levonorgestrel (MIRENA, 52 MG,) 20 MCG/24HR IUD    Sig: 1 Intra Uterine Device (1 each total) once for 1 dose by Intrauterine route.    Dispense:  1 Intra Uterine Device    Refill:  0     Plan:  Patient was given post-procedure instructions.  She was advised to have backup contraception for one week.   Call if you are having increasing pain, cramps or bleeding or if you have a fever greater than 100.4 degrees F., shaking chills, nausea or vomiting. Patient was also asked to check IUD strings periodically and follow up in 4 weeks for IUD check.  Return in about 4 weeks (around 02/13/2017) for annual/IUD chk  with Dr. Bonney AidStaebler.  Revia Nghiem B. Hillari Zumwalt, PA-C 01/16/2017 2:50 PM

## 2017-01-16 NOTE — Patient Instructions (Addendum)
I value your feedback and entrusting us with your care. If you get a  patient survey, I would appreciate you taking the time to let us know about your experience today. Thank you!  [facility] [phone]  Instructions after IUD insertion  Most women experience no significant problems after insertion of an IUD, however minor cramping and spotting for a few days is common. Cramps may be treated with ibuprofen 800mg every 8 hours or Tylenol 650 mg every 4 hours. Contact [facility] immediately if you experience any of the following symptoms during the next week: temperature >99.6 degrees, worsening pelvic pain, abdominal pain, fainting, unusually heavy vaginal bleeding, foul vaginal discharge, or if you think you have expelled the IUD.  Nothing inserted in the vagina for 48 hours. You will be scheduled for a follow up visit in approximately four weeks.  You should check monthly to be sure you can feel the IUD strings in the upper vagina. If you are having a monthly period, try to check after each period. If you cannot feel the IUD strings,  contact [facility] immediately so we can do an exam to determine if the IUD has been expelled.   Please use backup protection until we can confirm the IUD is in place.  Call [facility] if you are exposed to or diagnosed with a sexually transmitted infection, as we will need to discuss whether it is safe for you to continue using an IUD.   

## 2017-02-17 DIAGNOSIS — B001 Herpesviral vesicular dermatitis: Secondary | ICD-10-CM | POA: Diagnosis not present

## 2017-02-19 ENCOUNTER — Ambulatory Visit (INDEPENDENT_AMBULATORY_CARE_PROVIDER_SITE_OTHER): Payer: 59 | Admitting: Obstetrics and Gynecology

## 2017-02-19 ENCOUNTER — Encounter: Payer: Self-pay | Admitting: Obstetrics and Gynecology

## 2017-02-19 DIAGNOSIS — Z124 Encounter for screening for malignant neoplasm of cervix: Secondary | ICD-10-CM

## 2017-02-19 DIAGNOSIS — Z01419 Encounter for gynecological examination (general) (routine) without abnormal findings: Secondary | ICD-10-CM | POA: Diagnosis not present

## 2017-02-19 DIAGNOSIS — Z1231 Encounter for screening mammogram for malignant neoplasm of breast: Secondary | ICD-10-CM

## 2017-02-19 DIAGNOSIS — Z1239 Encounter for other screening for malignant neoplasm of breast: Secondary | ICD-10-CM

## 2017-02-19 NOTE — Progress Notes (Signed)
Gynecology Annual Exam  PCP: Patient, No Pcp Per  Chief Complaint:  Chief Complaint  Patient presents with  . Gynecologic Exam    IUD check    History of Present Illness: Patient is a 26 y.o. No obstetric history on file. presents for annual exam. The patient has no complaints today.   LMP: No LMP recorded. Just stopped bleeding after Mirena IUD insertion 4 weeks ago  The patient is sexually active. She currently uses IUD for contraception. She denies dyspareunia.  The patient does perform self breast exams. No breast complaints.   The patient wears seatbelts: yes.   The patient has regular exercise: not asked.    The patient denies current symptoms of depression.    Review of Systems: Review of Systems  Constitutional: Negative for chills and fever.  HENT: Negative for congestion.   Respiratory: Negative for cough and shortness of breath.   Cardiovascular: Negative for chest pain and palpitations.  Gastrointestinal: Negative for abdominal pain, constipation, diarrhea, heartburn, nausea and vomiting.  Genitourinary: Negative for dysuria, frequency and urgency.  Skin: Negative for itching and rash.  Neurological: Negative for dizziness and headaches.  Endo/Heme/Allergies: Negative for polydipsia.  Psychiatric/Behavioral: Negative for depression.    Past Medical History:  Past Medical History:  Diagnosis Date  . Complication of anesthesia    GETS AGGRESSIVE   . Endometriosis     Past Surgical History:  Past Surgical History:  Procedure Laterality Date  . CYST EXCISION    . DIAGNOSTIC LAPAROSCOPY    . ORIF ANKLE FRACTURE Left 09/07/2015   Procedure: OPEN REDUCTION INTERNAL FIXATION (ORIF) ANKLE FRACTURE;  Surgeon: Kennedy BuckerMichael Menz, MD;  Location: [facility] ORS;  Service: Orthopedics;  Laterality: Left;  . TONSILLECTOMY    . TONSILLECTOMY AND ADENOIDECTOMY      Gynecologic History:  No LMP recorded. Contraception: IUD (Mirena 01/16/2017) Last Pap: Results were:  12/06/2015 ASCUS with NEGATIVE high risk HPV   Obstetric History: No obstetric history on file.  Family History:  No family history on file.  Social History:  Social History   Socioeconomic History  . Marital status: Single    Spouse name: Not on file  . Number of children: Not on file  . Years of education: Not on file  . Highest education level: Not on file  Social Needs  . Financial resource strain: Not on file  . Food insecurity - worry: Not on file  . Food insecurity - inability: Not on file  . Transportation needs - medical: Not on file  . Transportation needs - non-medical: Not on file  Occupational History  . Not on file  Tobacco Use  . Smoking status: Never Smoker  . Smokeless tobacco: Never Used  Substance and Sexual Activity  . Alcohol use: No  . Drug use: No  . Sexual activity: Not Currently    Birth control/protection: IUD  Other Topics Concern  . Not on file  Social History Narrative  . Not on file    Allergies:  Allergies  Allergen Reactions  . Septra [Sulfamethoxazole-Trimethoprim] Rash    Medications: Prior to Admission medications   Medication Sig Start Date End Date Taking? Authorizing Provider  levonorgestrel (MIRENA, 52 MG,) 20 MCG/24HR IUD 1 Intra Uterine Device (1 each total) once for 1 dose by Intrauterine route. 01/16/17 01/16/17  Copland, Ilona SorrelAlicia B, PA-C    Physical Exam Blood pressure 116/72, pulse 80, height 5\' 5"  (1.651 m), weight 180 lb (81.6 kg).  General: NAD HEENT: normocephalic,  anicteric Thyroid: no enlargement, no palpable nodules Pulmonary: No increased work of breathing, CTAB Cardiovascular: RRR, distal pulses 2+ Breast: Breast symmetrical, no tenderness, no palpable nodules or masses, no skin or nipple retraction present, no nipple discharge.  No axillary or supraclavicular lymphadenopathy. Abdomen: NABS, soft, non-tender, non-distended.  Umbilicus without lesions.  No hepatomegaly, splenomegaly or masses palpable. No  evidence of hernia  Genitourinary:  External: Normal external female genitalia.  Normal urethral meatus, normal  Bartholin's and Skene's glands.    Vagina: Normal vaginal mucosa, no evidence of prolapse.    Cervix: Grossly normal in appearance, no bleeding  IUD string visualized  Uterus: Non-enlarged, mobile, normal contour.  No CMT  Adnexa: ovaries non-enlarged, no adnexal masses  Rectal: deferred  Lymphatic: no evidence of inguinal lymphadenopathy Extremities: no edema, erythema, or tenderness Neurologic: Grossly intact Psychiatric: mood appropriate, affect full  Female chaperone present for pelvic and breast  portions of the physical exam    Assessment: 26 y.o. No obstetric history on file. routine annual exam  Plan: Problem List Items Addressed This Visit    None    Visit Diagnoses    Screening for malignant neoplasm of cervix       Relevant Orders   Pap IG w/ reflex to HPV when ASC-U   Breast screening       Encounter for gynecological examination without abnormal finding          1) STI screening was not offered  2) ASCCP guidelines and rational discussed.  Patient opts for yearly screening interval  3) Contraception - IUD placed 01/16/2017  4) Routine healthcare maintenance including cholesterol, diabetes screening discussed managed by PCP  5) Follow up 1 year for routine annual exam

## 2017-02-19 NOTE — Patient Instructions (Signed)
Preventive Care 18-39 Years, Female Preventive care refers to lifestyle choices and visits with your health care provider that can promote health and wellness. What does preventive care include?  A yearly physical exam. This is also called an annual well check.  Dental exams once or twice a year.  Routine eye exams. Ask your health care provider how often you should have your eyes checked.  Personal lifestyle choices, including: ? Daily care of your teeth and gums. ? Regular physical activity. ? Eating a healthy diet. ? Avoiding tobacco and drug use. ? Limiting alcohol use. ? Practicing safe sex. ? Taking vitamin and mineral supplements as recommended by your health care provider. What happens during an annual well check? The services and screenings done by your health care provider during your annual well check will depend on your age, overall health, lifestyle risk factors, and family history of disease. Counseling Your health care provider may ask you questions about your:  Alcohol use.  Tobacco use.  Drug use.  Emotional well-being.  Home and relationship well-being.  Sexual activity.  Eating habits.  Work and work Statistician.  Method of birth control.  Menstrual cycle.  Pregnancy history.  Screening You may have the following tests or measurements:  Height, weight, and BMI.  Diabetes screening. This is done by checking your blood sugar (glucose) after you have not eaten for a while (fasting).  Blood pressure.  Lipid and cholesterol levels. These may be checked every 5 years starting at age 38.  Skin check.  Hepatitis C blood test.  Hepatitis B blood test.  Sexually transmitted disease (STD) testing.  BRCA-related cancer screening. This may be done if you have a family history of breast, ovarian, tubal, or peritoneal cancers.  Pelvic exam and Pap test. This may be done every 3 years starting at age 38. Starting at age 30, this may be done  every 5 years if you have a Pap test in combination with an HPV test.  Discuss your test results, treatment options, and if necessary, the need for more tests with your health care provider. Vaccines Your health care provider may recommend certain vaccines, such as:  Influenza vaccine. This is recommended every year.  Tetanus, diphtheria, and acellular pertussis (Tdap, Td) vaccine. You may need a Td booster every 10 years.  Varicella vaccine. You may need this if you have not been vaccinated.  HPV vaccine. If you are 39 or younger, you may need three doses over 6 months.  Measles, mumps, and rubella (MMR) vaccine. You may need at least one dose of MMR. You may also need a second dose.  Pneumococcal 13-valent conjugate (PCV13) vaccine. You may need this if you have certain conditions and were not previously vaccinated.  Pneumococcal polysaccharide (PPSV23) vaccine. You may need one or two doses if you smoke cigarettes or if you have certain conditions.  Meningococcal vaccine. One dose is recommended if you are age 68-21 years and a first-year college student living in a residence hall, or if you have one of several medical conditions. You may also need additional booster doses.  Hepatitis A vaccine. You may need this if you have certain conditions or if you travel or work in places where you may be exposed to hepatitis A.  Hepatitis B vaccine. You may need this if you have certain conditions or if you travel or work in places where you may be exposed to hepatitis B.  Haemophilus influenzae type b (Hib) vaccine. You may need this  if you have certain risk factors.  Talk to your health care provider about which screenings and vaccines you need and how often you need them. This information is not intended to replace advice given to you by your health care provider. Make sure you discuss any questions you have with your health care provider. Document Released: 04/16/2001 Document Revised:  11/08/2015 Document Reviewed: 12/20/2014 Elsevier Interactive Patient Education  2018 Elsevier Inc.  

## 2017-03-06 LAB — PAP IG W/ RFLX HPV ASCU: PAP Smear Comment: 0

## 2017-03-07 ENCOUNTER — Encounter: Payer: Self-pay | Admitting: Obstetrics and Gynecology

## 2017-06-13 ENCOUNTER — Ambulatory Visit (INDEPENDENT_AMBULATORY_CARE_PROVIDER_SITE_OTHER): Payer: Self-pay | Admitting: Family Medicine

## 2017-06-13 ENCOUNTER — Encounter: Payer: Self-pay | Admitting: Family Medicine

## 2017-06-13 VITALS — BP 138/90 | HR 85 | Temp 98.1°F | Wt 185.0 lb

## 2017-06-13 DIAGNOSIS — J302 Other seasonal allergic rhinitis: Secondary | ICD-10-CM

## 2017-06-13 DIAGNOSIS — H6591 Unspecified nonsuppurative otitis media, right ear: Secondary | ICD-10-CM

## 2017-06-13 DIAGNOSIS — J209 Acute bronchitis, unspecified: Secondary | ICD-10-CM

## 2017-06-13 MED ORDER — FLUCONAZOLE 150 MG PO TABS: 150.0000 mg | ORAL_TABLET | Freq: Once | ORAL | 0 refills | Status: AC

## 2017-06-13 MED ORDER — ALBUTEROL SULFATE 108 (90 BASE) MCG/ACT IN AEPB: 2.0000 | INHALATION_SPRAY | Freq: Four times a day (QID) | RESPIRATORY_TRACT | 0 refills | Status: DC | PRN

## 2017-06-13 MED ORDER — AMOXICILLIN-POT CLAVULANATE 875-125 MG PO TABS: 1.0000 | ORAL_TABLET | Freq: Two times a day (BID) | ORAL | 0 refills | Status: DC

## 2017-06-13 NOTE — Patient Instructions (Signed)
Start Augmentin 1 tablet twice daily with food to avoid stomach upset. Complete all medication.   Start Diflucan 150 mg once 3 days after starting antibiotic therapy.  I have also prescribed albuterol inhaler 2 puffs every 6 hours as needed.    Acute Bronchitis, Adult Acute bronchitis is when air tubes (bronchi) in the lungs suddenly get swollen. The condition can make it hard to breathe. It can also cause these symptoms:  A cough.  Coughing up clear, yellow, or green mucus.  Wheezing.  Chest congestion.  Shortness of breath.  A fever.  Body aches.  Chills.  A sore throat.  Follow these instructions at home: Medicines  Take over-the-counter and prescription medicines only as told by your doctor.  If you were prescribed an antibiotic medicine, take it as told by your doctor. Do not stop taking the antibiotic even if you start to feel better. General instructions  Rest.  Drink enough fluids to keep your pee (urine) clear or pale yellow.  Avoid smoking and secondhand smoke. If you smoke and you need help quitting, ask your doctor. Quitting will help your lungs heal faster.  Use an inhaler, cool mist vaporizer, or humidifier as told by your doctor.  Keep all follow-up visits as told by your doctor. This is important. How is this prevented? To lower your risk of getting this condition again:  Wash your hands often with soap and water. If you cannot use soap and water, use hand sanitizer.  Avoid contact with people who have cold symptoms.  Try not to touch your hands to your mouth, nose, or eyes.  Make sure to get the flu shot every year.  Contact a doctor if:  Your symptoms do not get better in 2 weeks. Get help right away if:  You cough up blood.  You have chest pain.  You have very bad shortness of breath.  You become dehydrated.  You faint (pass out) or keep feeling like you are going to pass out.  You keep throwing up (vomiting).  You have a  very bad headache.  Your fever or chills gets worse. This information is not intended to replace advice given to you by your health care provider. Make sure you discuss any questions you have with your health care provider. Document Released: 08/07/2007 Document Revised: 09/27/2015 Document Reviewed: 08/09/2015 Elsevier Interactive Patient Education  2018 [employer].  Otitis Media, Adult Otitis media is redness, soreness, and puffiness (swelling) in the space just behind your eardrum (middle ear). It may be caused by allergies or infection. It often happens along with a cold. Follow these instructions at home:  Take your medicine as told. Finish it even if you start to feel better.  Only take over-the-counter or prescription medicines for pain, discomfort, or fever as told by your doctor.  Follow up with your doctor as told. Contact a doctor if:  You have otitis media only in one ear, or bleeding from your nose, or both.  You notice a lump on your neck.  You are not getting better in 3-5 days.  You feel worse instead of better. Get help right away if:  You have pain that is not helped with medicine.  You have puffiness, redness, or pain around your ear.  You get a stiff neck.  You cannot move part of your face (paralysis).  You notice that the bone behind your ear hurts when you touch it. This information is not intended to replace advice given to you  by your health care provider. Make sure you discuss any questions you have with your health care provider. Document Released: 08/07/2007 Document Revised: 07/27/2015 Document Reviewed: 09/15/2012 Elsevier Interactive Patient Education  2017 [employer]

## 2017-06-13 NOTE — Progress Notes (Signed)
Patient ID: Kimberly Lloyd, female    DOB: 12/16/1990, 27 y.o.   MRN: [id number]  PCP: Patient, No Pcp Per  Chief Complaint  Patient presents with  . choice-Allergy issue/right ear pain    Subjective:  HPI  Kimberly Lloyd is a 27 y.o. female who complains of right ear pain, cervical adenopathy, purulent mucus production, cough, with chest tightness times 4 days.  She reports a history of seasonal allergies although denies a history of bronchitis or asthma.  She reports her last ear infection occurred 2 years ago.  General she is healthy.  She reports acute onset of right ear pain 2 days ago.  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  She endorses shortness of breath with activity although denies any wheezing or increased work of breathing.  She has tried attempted relief with Mucinex which has not improved her symptoms. Reports sick contact in home. Social History   Socioeconomic History  . Marital status: Single    Spouse name: Not on file  . Number of children: Not on file  . Years of education: Not on file  . Highest education level: Not on file  Occupational History  . Not on file  Social Needs  . Financial resource strain: Not on file  . Food insecurity:    Worry: Not on file    Inability: Not on file  . Transportation needs:    Medical: Not on file    Non-medical: Not on file  Tobacco Use  . Smoking status: Never Smoker  . Smokeless tobacco: Never Used  Substance and Sexual Activity  . Alcohol use: No  . Drug use: No  . Sexual activity: Not Currently    Birth control/protection: IUD  Lifestyle  . Physical activity:    Days per week: 4 days    Minutes per session: 60 min  . Stress: Not at all  Relationships  . Social connections:    Talks on phone: More than three times a week    Gets together: Once a week    Attends religious  service: More than 4 times per year    Active member of club or organization: Yes    Attends meetings of clubs or organizations: More than 4 times per year    Relationship status: Divorced  . Intimate partner violence:    Fear of current or ex partner: No    Emotionally abused: No    Physically abused: No    Forced sexual activity: No  Other Topics Concern  . Not on file  Social History Narrative  . Not on file    Review of Systems  Pertinent negatives listed in HPI  There are no active problems to display for this patient.  Allergies  Allergen Reactions  . Septra [Sulfamethoxazole-Trimethoprim] Rash    Prior to Admission medications   Medication Sig Start Date End Date Taking? Authorizing Provider  levonorgestrel (MIRENA, 52 MG,) 20 MCG/24HR IUD 1 Intra Uterine Device (1 each total) once for 1 dose by Intrauterine route. 01/16/17 01/16/17  Copland, Ilona Sorrel PA-C    Past Medical, Surgical Family and Social History reviewed and updated.    Objective:   Today's Vitals   06/13/17 1441  BP: 138/90  Pulse: 85  Temp: 98.1 F (36.7 C)  SpO2: 99%  Weight: 185 lb (83.9 kg)  Wt Readings from Last 3 Encounters:  06/13/17 185 lb (83.9 kg)  02/19/17 180 lb (81.6 kg)  01/16/17 173 lb (78.5 kg)   Physical Exam  Constitutional: She is oriented to person, place, and time. She appears well-developed and well-nourished.  HENT:  Right Ear: Tympanic membrane is erythematous and retracted.  Left Ear: Hearing, tympanic membrane, external ear and ear canal normal.  Bilateral nares grossly edematous with thick purulent   Cardiovascular: Normal rate, regular rhythm, normal heart sounds and intact distal pulses.  Pulmonary/Chest: Effort normal.  Decrease movement of air noted on ausculation   Neurological: She is alert and oriented to person, place, and time.  Psychiatric: She has a normal mood and affect. Her behavior is normal. Judgment and thought content normal.   Assessment  & Plan:  1. Acute bronchitis, unspecified organism 2. Right non-suppurative otitis media Start Augmentin 1 tablet every 12 hours x 10 days I have prescribed a Diflucan 150 mg 1 tablet to start 3 days after Augmentin to prevent vaginitis.  3. Seasonal allergies, likely causing dry cough and increased work of breathing. Will trial Albuterol inhaler 2 puffs every 6 hours as needed for shortness of breath.   If symptoms worsen or do not improve, return for follow-up, follow-up with PCP, or at the emergency department if severity of symptoms warrant a higher level of care.   Godfrey PickKimberly S. Tiburcio PeaHarris, MSN, FNP-C [address]  [place], [place] [id number] [phone]

## 2017-06-17 ENCOUNTER — Telehealth: Payer: Self-pay | Admitting: Emergency Medicine

## 2017-06-17 NOTE — Telephone Encounter (Signed)
Left message following up on visit with [facility] 

## 2017-07-29 ENCOUNTER — Ambulatory Visit (INDEPENDENT_AMBULATORY_CARE_PROVIDER_SITE_OTHER): Payer: Self-pay | Admitting: Family Medicine

## 2017-07-29 VITALS — BP 116/59 | HR 76 | Temp 98.5°F | Ht 67.0 in | Wt 197.0 lb

## 2017-07-29 DIAGNOSIS — Z0000 Encounter for general adult medical examination without abnormal findings: Secondary | ICD-10-CM

## 2017-07-29 NOTE — Progress Notes (Signed)
Subjective:  Kimberly Lloyd is a 27 y.o. female who presents for wellness exam. Patient denies any current health related concerns. She isokerphysically active of routine exercise several days weekly. Makes effort to eat healthy. She is a non-smoker. Current Body mass index is 30.85 kg/m. She currently doesn't have a PCP. Considers herself overall healthy. She currently has a Mirena in place and doesn't have a routine menstrual cycle. Denies any current problems or concerns.  There is no immunization history on file for this patient.  Past Medical History:  Diagnosis Date  . Complication of anesthesia    GETS AGGRESSIVE   . Endometriosis     Past Surgical History:  Procedure Laterality Date  . CYST EXCISION    . DIAGNOSTIC LAPAROSCOPY    . ORIF ANKLE FRACTURE Left 09/07/2015   Procedure: OPEN REDUCTION INTERNAL FIXATION (ORIF) ANKLE FRACTURE;  Surgeon: Kennedy Bucker, MD;  Location: [facility] ORS;  Service: Orthopedics;  Laterality: Left;  . TONSILLECTOMY    . TONSILLECTOMY AND ADENOIDECTOMY      Social History   Tobacco Use  . Smoking status: Never Smoker  . Smokeless tobacco: Never Used  Substance Use Topics  . Alcohol use: No  . Drug use: No    Allergies  Allergen Reactions  . Septra [Sulfamethoxazole-Trimethoprim] Rash    Current Outpatient Medications  Medication Sig Dispense Refill  . Albuterol Sulfate 108 (90 Base) MCG/ACT AEPB Inhale 2 puffs into the lungs every 6 (six) hours as needed. 1 each 0  . amoxicillin-clavulanate (AUGMENTIN) 875-125 MG tablet Take 1 tablet by mouth 2 (two) times daily. (Patient not taking: Reported on 07/29/2017) 20 tablet 0  . levonorgestrel (MIRENA, 52 MG,) 20 MCG/24HR IUD 1 Intra Uterine Device (1 each total) once for 1 dose by Intrauterine route. 1 Intra Uterine Device 0   No current facility-administered medications for this visit.     Review of Systems  All other systems reviewed and are negative.   BP (!) 116/59   Pulse 76   Temp  98.5 F (36.9 C)   Ht  (1.702 m)   Wt 197 lb (89.4 kg)   SpO2 97%   BMI 30.85 kg/m   Objective:  BP (!) 116/59   Pulse 76   Temp 98.5 F (36.9 C)   Ht  (1.702 m)   Wt 197 lb (89.4 kg)   SpO2 97%   BMI 30.85 kg/m   General Appearance:  Alert, cooperative, no distress, appears stated age  Head:  Normocephalic, without obvious abnormality, atraumatic  Eyes:  PERRL, conjunctiva/corneas clear, EOM's intact, fundi benign, both eyes  Ears:  Normal TM's and external ear canals, both ears  Nose: Nares normal, septum midline,mucosa normal, no drainage or sinus tenderness  Throat: Lips, mucosa, and tongue normal; teeth and gums normal  Neck: Supple, symmetrical, trachea midline, no adenopathy;  thyroid: not enlarged, symmetric, no tenderness/mass/nodules; no carotid bruit or JVD  Back:   Symmetric, no curvature, ROM normal, no CVA tenderness  Lungs:   Clear to auscultation bilaterally, respirations unlabored  Breasts:  No masses or tenderness  Heart:  Regular rate and rhythm, S1 and S2 normal, no murmur, rub, or gallop  Abdomen:   Soft, non-tender, bowel sounds active all four quadrants,  no masses, no organomegaly  Pelvic: Deferred  Extremities: Extremities normal, atraumatic, no cyanosis or edema  Pulses: 2+ and symmetric  Skin: Skin color, texture, turgor normal, no rashes or lesions  Lymph nodes: Cervical and supraclavicular  nodes  normal  Neurologic: Normal    Assessment and Plan:  Wellness Exam  Patient education provided.  No labs recommended at this time. Patient should make efforts to establish with a PCP.   Godfrey Pick. Tiburcio Pea, MSN, FNP-C [facility]  [address]  [place], [place] [address] [phone]

## 2017-07-29 NOTE — Patient Instructions (Signed)
Preventive Care 18-39 Years, Female Preventive care refers to lifestyle choices and visits with your health care provider that can promote health and wellness. What does preventive care include?  A yearly physical exam. This is also called an annual well check.  Dental exams once or twice a year.  Routine eye exams. Ask your health care provider how often you should have your eyes checked.  Personal lifestyle choices, including: ? Daily care of your teeth and gums. ? Regular physical activity. ? Eating a healthy diet. ? Avoiding tobacco and drug use. ? Limiting alcohol use. ? Practicing safe sex. ? Taking vitamin and mineral supplements as recommended by your health care provider. What happens during an annual well check? The services and screenings done by your health care provider during your annual well check will depend on your age, overall health, lifestyle risk factors, and family history of disease. Counseling Your health care provider may ask you questions about your:  Alcohol use.  Tobacco use.  Drug use.  Emotional well-being.  Home and relationship well-being.  Sexual activity.  Eating habits.  Work and work Statistician.  Method of birth control.  Menstrual cycle.  Pregnancy history.  Screening You may have the following tests or measurements:  Height, weight, and BMI.  Diabetes screening. This is done by checking your blood sugar (glucose) after you have not eaten for a while (fasting).  Blood pressure.  Lipid and cholesterol levels. These may be checked every 5 years starting at age 66.  Skin check.  Hepatitis C blood test.  Hepatitis B blood test.  Sexually transmitted disease (STD) testing.  BRCA-related cancer screening. This may be done if you have a family history of breast, ovarian, tubal, or peritoneal cancers.  Pelvic exam and Pap test. This may be done every 3 years starting at age 40. Starting at age 59, this may be done every 5  years if you have a Pap test in combination with an HPV test.  Discuss your test results, treatment options, and if necessary, the need for more tests with your health care provider. Vaccines Your health care provider may recommend certain vaccines, such as:  Influenza vaccine. This is recommended every year.  Tetanus, diphtheria, and acellular pertussis (Tdap, Td) vaccine. You may need a Td booster every 10 years.  Varicella vaccine. You may need this if you have not been vaccinated.  HPV vaccine. If you are 69 or younger, you may need three doses over 6 months.  Measles, mumps, and rubella (MMR) vaccine. You may need at least one dose of MMR. You may also need a second dose.  Pneumococcal 13-valent conjugate (PCV13) vaccine. You may need this if you have certain conditions and were not previously vaccinated.  Pneumococcal polysaccharide (PPSV23) vaccine. You may need one or two doses if you smoke cigarettes or if you have certain conditions.  Meningococcal vaccine. One dose is recommended if you are age 27-21 years and a first-year college student living in a residence hall, or if you have one of several medical conditions. You may also need additional booster doses.  Hepatitis A vaccine. You may need this if you have certain conditions or if you travel or work in places where you may be exposed to hepatitis A.  Hepatitis B vaccine. You may need this if you have certain conditions or if you travel or work in places where you may be exposed to hepatitis B.  Haemophilus influenzae type b (Hib) vaccine. You may need this if  you have certain risk factors.  Talk to your health care provider about which screenings and vaccines you need and how often you need them. This information is not intended to replace advice given to you by your health care provider. Make sure you discuss any questions you have with your health care provider. Document Released: 04/16/2001 Document Revised: 11/08/2015  Document Reviewed: 12/20/2014 Elsevier Interactive Patient Education  [employer].

## 2017-10-23 ENCOUNTER — Ambulatory Visit (INDEPENDENT_AMBULATORY_CARE_PROVIDER_SITE_OTHER): Payer: 59 | Admitting: Nurse Practitioner

## 2017-10-23 ENCOUNTER — Encounter: Payer: Self-pay | Admitting: Nurse Practitioner

## 2017-10-23 ENCOUNTER — Other Ambulatory Visit: Payer: Self-pay

## 2017-10-23 VITALS — BP 124/64 | HR 72 | Temp 98.5°F | Ht 65.0 in | Wt 205.0 lb

## 2017-10-23 DIAGNOSIS — F4321 Adjustment disorder with depressed mood: Secondary | ICD-10-CM

## 2017-10-23 DIAGNOSIS — M940 Chondrocostal junction syndrome [Tietze]: Secondary | ICD-10-CM | POA: Diagnosis not present

## 2017-10-23 DIAGNOSIS — Z7689 Persons encountering health services in other specified circumstances: Secondary | ICD-10-CM | POA: Diagnosis not present

## 2017-10-23 NOTE — Progress Notes (Signed)
Subjective:    Patient ID: Kimberly Lloyd, female    DOB: November 28, 1990, 28 y.o.   MRN: [id number]  Kimberly Lloyd is a 27 y.o. female presenOren Binetting on 10/23/2017 for Establish Care (intermittent pain in sternum area x 3 weeks )   HPI Establish Care New Provider Pt last seen by PCP many years ago.  Most recent care provided by Dr. Bonney AidStaebler.  [facility]    Costochondritis Patient presents today with intermittent left sternum pain x3 weeks.  Pain is described as sternum to left chest aching and slight pressure.  She has no increase of pain with deep breathing or movement.  It does begin to hurt while driving.  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  Other movements also cause pain, but these are unpredictable.  Patient experiences pain most with a push-up motion during yoga class.  Patient participates in hot yoga.  Pain is improving some with yoga break until Sunday/Monday.    Grief Patient found her older brother after sudden death.  No specific cause of death to date.  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  So far has very supportive coworkers and has had time to continue grieving.  Otherwise, is adjusting appropriately and continuing to be able to complete daily responsibilities.  Past Medical History:  Diagnosis Date  . Complication of anesthesia    GETS AGGRESSIVE   . Endometriosis    Past Surgical History:  Procedure Laterality Date  . CYST EXCISION    . DIAGNOSTIC LAPAROSCOPY    . ORIF ANKLE FRACTURE Left 09/07/2015   Procedure: OPEN REDUCTION INTERNAL FIXATION (ORIF) ANKLE FRACTURE;  Surgeon: Tkeyah Burkman BuckerMichael Menz, MD;  Location: [facility] ORS;  Service: Orthopedics;  Laterality: Left;  . TONSILLECTOMY    . TONSILLECTOMY AND ADENOIDECTOMY     Social History   Socioeconomic History  . Marital  status: Single    Spouse name: Not on file  . Number of children: Not on file  . Years of education: Not on file  . Highest education level: Not on file  Occupational History  . Not on file  Social Needs  . Financial resource strain: Not on file  . Food insecurity:    Worry: Not on file    Inability: Not on file  . Transportation needs:    Medical: Not on file    Non-medical: Not on file  Tobacco Use  . Smoking status: Never Smoker  . Smokeless tobacco: Never Used  Substance and Sexual Activity  . Alcohol use: No  . Drug use: No  . Sexual activity: Not Currently    Birth control/protection: IUD  Lifestyle  . Physical activity:    Days per week: 4 days    Minutes per session: 60 min  . Stress: Not at all  Relationships  . Social connections:    Talks on phone: More than three times a week    Gets together: Once a week    Attends religious service: More than 4 times per year    Active member of club or organization: Yes    Attends meetings of clubs or organizations: More than 4 times per year    Relationship status: Divorced  . Intimate partner violence:    Fear of current or ex partner: No    Emotionally abused:  No    Physically abused: No    Forced sexual activity: No  Other Topics Concern  . Not on file  Social History Narrative  . Not on file   No family history on file. Current Outpatient Medications on File Prior to Visit  Medication Sig  . levonorgestrel (MIRENA, 52 MG,) 20 MCG/24HR IUD 1 Intra Uterine Device (1 each total) once for 1 dose by Intrauterine route.  . valACYclovir (VALTREX) 1000 MG tablet    No current facility-administered medications on file prior to visit.     Review of Systems  Constitutional: Negative for chills and fever.  HENT: Negative for congestion and sore throat.   Eyes: Negative for pain.  Respiratory: Negative for cough, shortness of breath and wheezing.   Cardiovascular: Negative for chest pain, palpitations and leg  swelling.  Gastrointestinal: Negative for abdominal pain, blood in stool, constipation, diarrhea, nausea and vomiting.  Endocrine: Negative for polydipsia.  Genitourinary: Negative for dysuria, frequency, hematuria and urgency.  Musculoskeletal: Positive for arthralgias and myalgias. Negative for back pain and neck pain.       Musculoskeletal chest discomfort reproduced with increased pressure at home and with certain motions of left arm.  Skin: Negative.  Negative for rash.  Allergic/Immunologic: Negative for environmental allergies.  Neurological: Negative for dizziness, weakness and headaches.  Hematological: Does not bruise/bleed easily.  Psychiatric/Behavioral: Negative for dysphoric mood and suicidal ideas. The patient is not nervous/anxious.        Is having flashbacks to brother's death occasionally.  Also tearful with memories of brother.   Per HPI unless specifically indicated above     Objective:    BP 124/64 (BP Location: Right Arm, Patient Position: Sitting, Cuff Size: Normal)   Pulse 72   Temp 98.5 F (36.9 C) (Oral)   Ht 5\' 5"  (1.651 m)   Wt 205 lb (93 kg)   BMI 34.11 kg/m   Wt Readings from Last 3 Encounters:  10/23/17 205 lb (93 kg)  07/29/17 197 lb (89.4 kg)  06/13/17 185 lb (83.9 kg)    Physical Exam  Constitutional: She is oriented to person, place, and time. She appears well-developed and well-nourished. No distress.  HENT:  Head: Normocephalic and atraumatic.  Cardiovascular: Normal rate, regular rhythm, S1 normal, S2 normal, normal heart sounds and intact distal pulses.  Pulmonary/Chest: Effort normal and breath sounds normal. No respiratory distress.    Neurological: She is alert and oriented to person, place, and time.  Skin: Skin is warm and dry. Capillary refill takes less than 2 seconds.  Psychiatric: She has a normal mood and affect. Her behavior is normal. Judgment and thought content normal.  Vitals reviewed.    Results for orders placed or  performed in visit on 02/19/17  Pap IG w/ reflex to HPV when ASC-U  Result Value Ref Range   DIAGNOSIS: Comment    Specimen adequacy: Comment    Clinician Provided ICD10 Comment    Performed by: Comment    PAP Smear Comment .    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   Plan:  1. Treat with OTC  pain meds (acetaminophen and naproxen).  Discussed alternate dosing and max dosing. - TAKE NSAID naproxen 440 mg bid x 14 days 2. Apply heat and/or ice to affected area. 3. May also apply a muscle rub with lidocaine or lidocaine patch after heat or ice. 4. Considered Chest Xray today, but deferred as musculoskeletal and reproducible pain is likely to be soft tissue injury. 5. Follow up 2-4 weeks prn.     Encounter to establish care     Previous PCP was many years ago.  Acute care at [facility], wellness performed at GYN in past.  Records are reviewed in [facility].  Past medical, family, and surgical history reviewed w/ patient in clinic today.     Grief reaction     Normal and expected grief reaction to brother's death.  Patient is at high risk for PTSD and is experiencing flashbacks with triggers currently.  Recommended patient seek assistance at [facility] or other counseling in near future.  Notify clinic if no longer adapting appropriately or able to carry out daily tasks at work or home.  Followup prn.         Follow up plan: Return if symptoms worsen or fail to improve.  Wilhelmina Mcardle, DNP, AGPCNP-BC Adult Gerontology Primary Care Nurse Practitioner [facility] 10/23/2017, 2:29 PM

## 2017-10-23 NOTE — Patient Instructions (Signed)
Kimberly Lloyd,   Thank you for coming in to clinic today.  1. You have a  muscle strain or costochondritis.  - START naproxen sodium 440 - 500 mg twice daily for 14 days.  Start taking Tylenol extra strength 1 to 2 tablets every 6-8 hours for aches or fever/chills for next few days as needed.  Do not take more than 3,000 mg in 24 hours from all medicines.   - Use heat and ice.  Apply this for 15 minutes at a time 6-8 times per day.   - Muscle rub with lidocaine, lidocaine patch, Biofreeze, or tiger balm for topical pain relief.  Avoid using this with heat and ice to avoid burns.  Please schedule a follow-up appointment with Wilhelmina Mcardle, AGNP. Return if symptoms worsen or fail to improve.  If you have any other questions or concerns, please feel free to call the clinic or send a message through MyChart. You may also schedule an earlier appointment if necessary.  You will receive a survey after today's visit either digitally by e-mail or paper by [employer]. Your experiences and feedback matter to [place].  Please respond so we know how we are doing as we provide care for you.   Wilhelmina Mcardle, DNP, AGNP-BC Adult Gerontology Nurse Practitioner [facility], [facility]  You have plantar fasciitis.  Plantar fasciitis is a painful foot condition that affects the heel. It occurs when the band of tissue that connects the toes to the heel bone (plantar fascia) becomes irritated. This can happen after exercising too much or doing other repetitive activities (overuse injury). The pain from plantar fasciitis can range from mild irritation to severe pain that makes it difficult for you to walk or move. The pain is usually worse in the morning or after you have been sitting or lying down for a while.  This condition may be caused by:  Standing for long periods of time.  Wearing shoes that do not fit.  Doing high-impact activities, including running, aerobics, and ballet.  Being  overweight.  Having an abnormal way of walking (gait).  Having tight calf muscles.  Having high arches in your feet.  Starting a new athletic activity.  Follow these instructions at home:  Roll the bottom of your foot over a bag of ice or a bottle of cold water. Do this for 20 minutes, 3-4 times a day.  Wear supportive shoes at all times, even when walking in your own home.  Wearing athletic shoes with air-sole or gel-sole cushions or soft shoe inserts may be most helpful.  Weight loss will also reduce the stress on your feet.  Work toward losing about 15 lbs in the next 3 months.  Perform exercises below:  Start with 1 or 2 of these exercises that you are most comfortable with. Do not do any exercises that cause you significant worsening pain. Some of these may cause some "stretching soreness" but it should go away after you stop the exercise, and get better over time. Gradually increase up to 3-4 exercises as tolerated.  You may begin exercising the muscles of your foot right away by gently stretching them as follows:  Stretching: Towel stretch: Sit on a hard surface with your injured leg stretched out in front of you. Loop a towel around the ball of your foot and pull the towel toward your body keeping your knee straight. Hold this position for 15 to 30 seconds then relax. Repeat 3 times. When the  towel stretch becomes to easy, you may begin doing the standing calf stretch.  Standing calf stretch: Facing a wall, put your hands against the wall at about eye level. Keep the injured leg back, the uninjured leg forward, and the heel of your injured leg on the floor. Turn your injured foot slightly inward (as if you were pigeon-toed) as you slowly lean into the wall until you feel a stretch in the back of your calf. Hold for 15 to 30 seconds. Repeat 3 times. Do this exercise several times each day. When you can stand comfortably on your injured foot, you can begin stretching the bottom of  your foot using the plantar fascia stretch.  Plantar fascia stretch: Stand with the ball of your injured foot on a stair. Reach for the bottom step with your heel until you feel a stretch in the arch of your foot. Hold this position for 15 to 30 seconds and then relax. Repeat 3 times. After you have stretched the bottom muscles of your foot, you can begin strengthening the top muscles of your foot.  Frozen can roll: Roll your bare injured foot back and forth from your heel to your mid-arch over a frozen juice can. Repeat for 3 to 5 minutes. This exercise is particularly helpful if done first thing in the morning.  Towel pickup: With your heel on the ground, pick up a towel with your toes. Release. Repeat 10 to 20 times. When this gets easy, add more resistance by placing a book or small weight on the towel.  Static and dynamic balance exercises: Place a chair next to your non-injured leg and stand upright. (This will provide you with balance if needed.) Stand on your injured foot. Try to raise the arch of your foot while keeping your toes on the floor. Try to maintain this position and balance on your injured side for 30 seconds. This exercise can be made more difficult by doing it on a piece of foam or a pillow, or with your eyes closed.  Stand in the same position as above. Keep your foot in this position and reach forward in front of you with your injured side's hand, allowing your knee to bend. Repeat this 10 times while maintaining the arch height. This exercise can be made more difficult by reaching farther in front of you. Do 2 sets.  Stand in the same position as above. While maintaining your arch height, reach the injured side's hand across your body toward the chair. The farther you reach, the more challenging the exercise. Do 2 sets of 10.  Next, you can begin strengthening the muscles of your foot and lower leg by using elastic tubing.  Strengthening: Resisted dorsiflexion: Sit with  your injured leg out straight and your foot facing a doorway. Tie a loop in one end of the tubing. Put your foot through the loop so that the tubing goes around the arch of your foot. Tie a knot in the other end of the tubing and shut the knot in the door. Move backward until there is tension in the tubing. Keeping your knee straight, pull your foot toward your body, stretching the tubing. Slowly return to the starting position. Do 3 sets of 10.  Resisted plantar flexion: Sit with your leg outstretched and loop the middle section of the tubing around the ball of your foot. Hold the ends of the tubing in both hands. Gently press the ball of your foot down and point your toes, stretching  the tubing. Return to the starting position. Do 3 sets of 10.  Resisted inversion: Sit with your legs out straight and cross your uninjured leg over your injured ankle. Wrap the tubing around the ball of your injured foot and then loop it around your uninjured foot so that the tubing is anchored there at one end. Hold the other end of the tubing in your hand. Turn your injured foot inward and upward. This will stretch the tubing. Return to the starting position. Do 3 sets of 10.  Resisted eversion: Sit with both legs stretched out in front of you, with your feet about a shoulder's width apart. Tie a loop in one end of the tubing. Put your injured foot through the loop so that the tubing goes around the arch of that foot and wraps around the outside of the uninjured foot. Hold onto the other end of the tubing with your hand to provide tension. Turn your injured foot up and out. Make sure you keep your uninjured foot still so that it will allow the tubing to stretch as you move your injured foot. Return to the starting position. Do 3 sets of 10.

## 2017-10-24 ENCOUNTER — Encounter: Payer: Self-pay | Admitting: Nurse Practitioner

## 2017-10-27 ENCOUNTER — Encounter: Payer: Self-pay | Admitting: Nurse Practitioner

## 2018-02-27 ENCOUNTER — Ambulatory Visit: Payer: 59 | Admitting: Obstetrics and Gynecology

## 2018-03-19 ENCOUNTER — Ambulatory Visit (INDEPENDENT_AMBULATORY_CARE_PROVIDER_SITE_OTHER): Payer: 59 | Admitting: Obstetrics and Gynecology

## 2018-03-19 ENCOUNTER — Encounter: Payer: Self-pay | Admitting: Obstetrics and Gynecology

## 2018-03-19 VITALS — BP 132/71 | HR 86 | Ht 65.0 in | Wt 212.0 lb

## 2018-03-19 DIAGNOSIS — N809 Endometriosis, unspecified: Secondary | ICD-10-CM

## 2018-03-19 DIAGNOSIS — Z1239 Encounter for other screening for malignant neoplasm of breast: Secondary | ICD-10-CM

## 2018-03-19 DIAGNOSIS — R51 Headache: Secondary | ICD-10-CM | POA: Diagnosis not present

## 2018-03-19 DIAGNOSIS — Z1329 Encounter for screening for other suspected endocrine disorder: Secondary | ICD-10-CM | POA: Diagnosis not present

## 2018-03-19 DIAGNOSIS — R519 Headache, unspecified: Secondary | ICD-10-CM

## 2018-03-19 DIAGNOSIS — N643 Galactorrhea not associated with childbirth: Secondary | ICD-10-CM

## 2018-03-19 DIAGNOSIS — Z01419 Encounter for gynecological examination (general) (routine) without abnormal findings: Secondary | ICD-10-CM

## 2018-03-19 MED ORDER — ELAGOLIX SODIUM 150 MG PO TABS: 1.0000 | ORAL_TABLET | Freq: Every day | ORAL | 5 refills | Status: DC

## 2018-03-19 NOTE — Progress Notes (Addendum)
Gynecology Annual Exam   PCP: Galen ManilaKennedy, Lauren Renee, NP  Chief Complaint:  Chief Complaint  Patient presents with  . Gynecologic Exam    Breast discharge    History of Present Illness: Patient is a 28 y.o. No obstetric history on file. presents for annual exam. The patient has no complaints today.   LMP: No LMP recorded. (Menstrual status: IUD).  The patient is sexually active. She currently uses IUD (mirena) for contraception (Second Mirena dyspareunia with first, breakthrough bleeding on Lo Loestrin and Sprintec) . She denies dyspareunia.  The patient does not perform self breast exams.  There is no notable family history of breast or ovarian cancer in her family.  The patient wears seatbelts: yes.   The patient has regular exercise: not asked.    The patient denies current symptoms of depression.    Review of Systems: Review of Systems  Constitutional: Negative for chills and fever.  HENT: Negative for congestion.   Respiratory: Negative for cough and shortness of breath.   Cardiovascular: Negative for chest pain and palpitations.  Gastrointestinal: Negative for abdominal pain, constipation, diarrhea, heartburn, nausea and vomiting.  Genitourinary: Negative for dysuria, frequency and urgency.  Skin: Negative for itching and rash.  Neurological: Negative for dizziness and headaches.  Endo/Heme/Allergies: Negative for polydipsia.  Psychiatric/Behavioral: Negative for depression.    Past Medical History:  Past Medical History:  Diagnosis Date  . Complication of anesthesia    GETS AGGRESSIVE   . Endometriosis     Past Surgical History:  Past Surgical History:  Procedure Laterality Date  . CYST EXCISION    . DIAGNOSTIC LAPAROSCOPY    . ORIF ANKLE FRACTURE Left 09/07/2015   Procedure: OPEN REDUCTION INTERNAL FIXATION (ORIF) ANKLE FRACTURE;  Surgeon: Kennedy BuckerMichael Menz, MD;  Location: [facility] ORS;  Service: Orthopedics;  Laterality: Left;  . TONSILLECTOMY    . TONSILLECTOMY  AND ADENOIDECTOMY      Gynecologic History:  No LMP recorded. (Menstrual status: IUD). Contraception: 01/16/2017 Mirena IUD Last Pap: Results were: 02/19/2017 no abnormalities  12/06/2015 ASCUS HPV negative  Obstetric History: No obstetric history on file.  Family History:  Family History  Problem Relation Age of Onset  . Healthy Mother   . Healthy Father   . Healthy Brother   . Atrial fibrillation Maternal Grandmother   . Lung cancer Paternal Grandmother   . Lung cancer Paternal Grandfather   . Sudden death Brother   . Breast cancer Other     Social History:  Social History   Socioeconomic History  . Marital status: Single    Spouse name: Not on file  . Number of children: Not on file  . Years of education: Not on file  . Highest education level: Bachelor's degree (e.g., BA, AB, BS)  Occupational History  . Occupation: Designer, jewelleryegistered Nurse  Social Needs  . Financial resource strain: Not hard at all  . Food insecurity:    Worry: Never true    Inability: Never true  . Transportation needs:    Medical: No    Non-medical: No  Tobacco Use  . Smoking status: Never Smoker  . Smokeless tobacco: Never Used  Substance and Sexual Activity  . Alcohol use: Not Currently    Comment: possibly 1 x per year  . Drug use: No  . Sexual activity: Not Currently    Birth control/protection: I.U.D.  Lifestyle  . Physical activity:    Days per week: 4 days    Minutes per session: 60  min  . Stress: Not at all  Relationships  . Social connections:    Talks on phone: More than three times a week    Gets together: Once a week    Attends religious service: More than 4 times per year    Active member of club or organization: Yes    Attends meetings of clubs or organizations: More than 4 times per year    Relationship status: Divorced  . Intimate partner violence:    Fear of current or ex partner: No    Emotionally abused: No    Physically abused: No    Forced sexual activity: No    Other Topics Concern  . Not on file  Social History Narrative  . Not on file    Allergies:  Allergies  Allergen Reactions  . Septra [Sulfamethoxazole-Trimethoprim] Rash    Medications: Prior to Admission medications   Medication Sig Start Date End Date Taking? Authorizing Provider  levonorgestrel (MIRENA, 52 MG,) 20 MCG/24HR IUD 1 Intra Uterine Device (1 each total) once for 1 dose by Intrauterine route. 01/16/17 01/16/17  Copland, Ilona Sorrel, PA-C  valACYclovir (VALTREX) 1000 MG tablet  09/06/17   [provider]    Physical Exam Vitals: Blood pressure 132/71, pulse 86, height 5\' 5"  (1.651 m), weight 212 lb (96.2 kg).  General: NAD HEENT: normocephalic, anicteric Thyroid: no enlargement, no palpable nodules Pulmonary: No increased work of breathing, CTAB Cardiovascular: RRR, distal pulses 2+ Breast: Breast symmetrical, no tenderness, no palpable nodules or masses, no skin or nipple retraction present, no nipple discharge.  No axillary or supraclavicular lymphadenopathy. Abdomen: NABS, soft, non-tender, non-distended.  Umbilicus without lesions.  No hepatomegaly, splenomegaly or masses palpable. No evidence of hernia  Genitourinary:  External: Normal external female genitalia.  Normal urethral meatus, normal Bartholin's and Skene's glands.    Vagina: Normal vaginal mucosa, no evidence of prolapse.    Cervix: Grossly normal in appearance, no bleeding, IUD strings visualized  Uterus: Non-enlarged, mobile, normal contour.  No CMT  Adnexa: ovaries non-enlarged, no adnexal masses  Rectal: deferred  Lymphatic: no evidence of inguinal lymphadenopathy Extremities: no edema, erythema, or tenderness Neurologic: Grossly intact Psychiatric: mood appropriate, affect full  Female chaperone present for pelvic and breast  portions of the physical exam    Assessment: 28 y.o. No obstetric history on file. routine annual exam  Plan: Problem List Items Addressed This Visit    None     Visit Diagnoses    Breast screening    -  Primary   Encounter for gynecological examination without abnormal finding       Galactorrhea       Relevant Orders   Prolactin (Completed)   TSH (Completed)   Nonintractable headache, unspecified chronicity pattern, unspecified headache type       Relevant Orders   Prolactin (Completed)   TSH (Completed)   Thyroid disorder screening       Relevant Orders   Prolactin (Completed)   TSH (Completed)   Endometriosis          1) STI screening  was notoffered and therefore not obtained  2)  ASCCP guidelines and rational discussed.  Patient opts for every 3 years screening interval  3) Contraception - the patient is currently using  IUD.  She is happy with her current form of contraception and plans to continue  4) Routine healthcare maintenance including cholesterol, diabetes screening discussed managed by PCP  5) Endometriosis - overall good response to Mirena IUD, some  residual bladder symptoms which may also be secondary to prior diagnosis of interstitial cystitis.  Trial of Orilissa.  6) Galactorrhea - left breast, if normal labs and continues consider cytology.  Even continued galactorrhea and normal prolactin would consider MRI for pituitary adenoma.  7)  Return in about 4 weeks (around 04/16/2018) for medication follow up.   Vena AustriaAndreas Danijela Vessey, MD, Evern CoreFACOG [facility], [facility] 03/19/2018, 1:10 PM

## 2018-03-20 LAB — PROLACTIN: Prolactin: 13.4 ng/mL (ref 4.8–23.3)

## 2018-03-20 LAB — TSH: TSH: 2.31 u[IU]/mL (ref 0.450–4.500)

## 2018-03-23 ENCOUNTER — Telehealth: Payer: Self-pay

## 2018-03-23 NOTE — Telephone Encounter (Signed)
Please let patient know AMS is out of the office today and will be back tomorrow. Once he reviews labs he will call her or send MyChart message then release them.

## 2018-03-23 NOTE — Telephone Encounter (Signed)
Pt had blood work done Friday.  Was told they would be back Saturday but pt doesn't see them.  [phone]

## 2018-03-23 NOTE — Telephone Encounter (Signed)
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 I just can't tell the ramifications of the.  So, pt aware of numerical results of prolactin and TSH.  She was thinking another test was ordered.  Adv that was all that was ordered.

## 2018-03-27 ENCOUNTER — Other Ambulatory Visit: Payer: Self-pay | Admitting: Obstetrics and Gynecology

## 2018-03-27 DIAGNOSIS — Z5181 Encounter for therapeutic drug level monitoring: Secondary | ICD-10-CM

## 2018-04-02 ENCOUNTER — Emergency Department: Payer: 59

## 2018-04-02 ENCOUNTER — Encounter: Payer: Self-pay | Admitting: Emergency Medicine

## 2018-04-02 ENCOUNTER — Emergency Department
Admission: EM | Admit: 2018-04-02 | Discharge: 2018-04-02 | Disposition: A | Discharge status: Home / Self Care | Payer: 59 | Attending: Emergency Medicine | Admitting: Emergency Medicine

## 2018-04-02 DIAGNOSIS — G43001 Migraine without aura, not intractable, with status migrainosus: Secondary | ICD-10-CM | POA: Insufficient documentation

## 2018-04-02 DIAGNOSIS — R42 Dizziness and giddiness: Secondary | ICD-10-CM | POA: Diagnosis not present

## 2018-04-02 DIAGNOSIS — R51 Headache: Secondary | ICD-10-CM | POA: Diagnosis not present

## 2018-04-02 DIAGNOSIS — H538 Other visual disturbances: Secondary | ICD-10-CM | POA: Insufficient documentation

## 2018-04-02 DIAGNOSIS — R110 Nausea: Secondary | ICD-10-CM | POA: Diagnosis not present

## 2018-04-02 DIAGNOSIS — Z79899 Other long term (current) drug therapy: Secondary | ICD-10-CM | POA: Diagnosis not present

## 2018-04-02 MED ORDER — SUMATRIPTAN SUCCINATE 50 MG PO TABS: 50.0000 mg | ORAL_TABLET | Freq: Once | ORAL | 2 refills | Status: DC | PRN

## 2018-04-02 MED ORDER — METOCLOPRAMIDE HCL 5 MG/ML IJ SOLN
10.0000 mg | Freq: Once | INTRAMUSCULAR | Status: AC
  Administered 2018-04-02: 10 mg via INTRAVENOUS
  Filled 2018-04-02: qty 2

## 2018-04-02 MED ORDER — KETOROLAC TROMETHAMINE 30 MG/ML IJ SOLN
30.0000 mg | Freq: Once | INTRAMUSCULAR | Status: AC
  Administered 2018-04-02: 30 mg via INTRAVENOUS
  Filled 2018-04-02: qty 1

## 2018-04-02 MED ORDER — METOCLOPRAMIDE HCL 5 MG/ML IJ SOLN
20.0000 mg | Freq: Once | INTRAVENOUS | Status: DC
  Filled 2018-04-02: qty 4

## 2018-04-02 MED ORDER — ONDANSETRON HCL 4 MG/2ML IJ SOLN
4.0000 mg | Freq: Once | INTRAMUSCULAR | Status: AC
  Administered 2018-04-02: 4 mg via INTRAVENOUS
  Filled 2018-04-02: qty 2

## 2018-04-02 MED ORDER — DIPHENHYDRAMINE HCL 50 MG/ML IJ SOLN
25.0000 mg | Freq: Once | INTRAMUSCULAR | Status: AC
  Administered 2018-04-02: 25 mg via INTRAVENOUS
  Filled 2018-04-02: qty 1

## 2018-04-02 NOTE — ED Provider Notes (Signed)
[facility] Emergency Department Provider Note   ____________________________________________   First MD Initiated Contact with Patient 04/02/18 1257     (approximate)  I have reviewed the triage vital signs and the nursing notes.   HISTORY  Chief Complaint Migraine    HPI Kimberly Lloyd is a 28 y.o. female patient presents with pounding headache and dizziness.  Patient stated complaint started earlier this week and is getting worse.  Patient stated no relief with over-the-counter Excedrin Migraine.  Patient stated mild nausea and intermittent blurry vision.  Patient state history of migraine but is the worst she ever experience.  Patient is adamant that she did not want any narcotic medication for her headache.  Patient denies facial body weakness.    Past Medical History:  Diagnosis Date  . Complication of anesthesia    GETS AGGRESSIVE   . Endometriosis     There are no active problems to display for this patient.   Past Surgical History:  Procedure Laterality Date  . CYST EXCISION    . DIAGNOSTIC LAPAROSCOPY    . ORIF ANKLE FRACTURE Left 09/07/2015   Procedure: OPEN REDUCTION INTERNAL FIXATION (ORIF) ANKLE FRACTURE;  Surgeon: Kennedy Bucker, MD;  Location: [facility] ORS;  Service: Orthopedics;  Laterality: Left;  . TONSILLECTOMY    . TONSILLECTOMY AND ADENOIDECTOMY      Prior to Admission medications   Medication Sig Start Date End Date Taking? Authorizing Provider  Elagolix Sodium (ORILISSA) 150 MG TABS Take 1 tablet by mouth daily. 03/19/18   Vena Austria, MD  levonorgestrel (MIRENA, 52 MG,) 20 MCG/24HR IUD 1 Intra Uterine Device (1 each total) once for 1 dose by Intrauterine route. 01/16/17 01/16/17  Copland, Ilona Sorrel, PA-C  SUMAtriptan (IMITREX) 50 MG tablet Take 1 tablet (50 mg total) by mouth once as needed for migraine. May repeat in 2 hours if headache persists or recurs. 04/02/18 04/03/19  Joni Reining, PA-C  valACYclovir (VALTREX)  1000 MG tablet  09/06/17   [provider]    Allergies Septra [sulfamethoxazole-trimethoprim]  Family History  Problem Relation Age of Onset  . Healthy Mother   . Healthy Father   . Healthy Brother   . Atrial fibrillation Maternal Grandmother   . Lung cancer Paternal Grandmother   . Lung cancer Paternal Grandfather   . Sudden death Brother   . Breast cancer Other     Social History Social History   Tobacco Use  . Smoking status: Never Smoker  . Smokeless tobacco: Never Used  Substance Use Topics  . Alcohol use: Not Currently    Comment: possibly 1 x per year  . Drug use: No    Review of Systems Constitutional: No fever/chills Eyes: Photophobia  ENT: No sore throat.  Nasal congestion. Cardiovascular: Denies chest pain. Respiratory: Denies shortness of breath. Gastrointestinal: No abdominal pain.  Nausea, no vomiting.  No diarrhea.  No constipation. Genitourinary: Negative for dysuria. Musculoskeletal: Negative for back pain. Skin: Negative for rash. Neurological: Positive for headaches, denies focal weakness or numbness. Allergic/Immunilogical: Bactrim ____________________________________________   PHYSICAL EXAM:  VITAL SIGNS: ED Triage Vitals  Enc Vitals Group     BP 04/02/18 1151 (!) 146/91     Pulse Rate 04/02/18 1151 83     Resp 04/02/18 1151 20     Temp 04/02/18 1151 98.8 F (37.1 C)     Temp Source 04/02/18 1151 Oral     SpO2 04/02/18 1151 98 %     Weight 04/02/18 1154  208 lb (94.3 kg)     Height 04/02/18 1154 5\' 5"  (1.651 m)     Head Circumference --      Peak Flow --      Pain Score 04/02/18 1153 7     Pain Loc --      Pain Edu? --      Excl. in GC? --    Constitutional: Alert and oriented. Well appearing and in no acute distress. Eyes: Deferred secondary to photophobia.   Head: Atraumatic. Nose: No congestion/rhinnorhea. Mouth/Throat: Mucous membranes are moist.  Oropharynx non-erythematous. Neck: No stridor.  No cervical spine  tenderness to palpation. Hematological/Lymphatic/Immunilogical: No cervical lymphadenopathy. Cardiovascular: Normal rate, regular rhythm. Grossly normal heart sounds.  Good peripheral circulation. Respiratory: Normal respiratory effort.  No retractions. Lungs CTAB. Neurologic:  Normal speech and language. No gross focal neurologic deficits are appreciated. No gait instability. Skin:  Skin is warm, dry and intact. No rash noted. Psychiatric: Mood and affect are normal. Speech and behavior are normal.  ____________________________________________   LABS (all labs ordered are listed, but only abnormal results are displayed)  Labs Reviewed - No data to display ____________________________________________  EKG   ____________________________________________  RADIOLOGY  ED MD interpretation:    Official radiology report(s): Ct Head Wo Contrast  Result Date: 04/02/2018 CLINICAL DATA:  Headaches EXAM: CT HEAD WITHOUT CONTRAST TECHNIQUE: Contiguous axial images were obtained from the base of the skull through the vertex without intravenous contrast. COMPARISON:  None. FINDINGS: Brain: No evidence of acute infarction, hemorrhage, hydrocephalus, extra-axial collection or mass lesion/mass effect. Vascular: No hyperdense vessel or unexpected calcification. Skull: Normal. Negative for fracture or focal lesion. Sinuses/Orbits: No acute finding. Other: None. IMPRESSION: No acute abnormality noted. Electronically Signed   By: Alcide Clever M.D.   On: 04/02/2018 13:21    ____________________________________________   PROCEDURES  Procedure(s) performed:   Procedures  Critical Care performed: No  ____________________________________________   INITIAL IMPRESSION / ASSESSMENT AND PLAN / ED COURSE  As part of my medical decision making, I reviewed the following data within the electronic MEDICAL RECORD NUMBER     Patient presents with increasing "pounding" headache for 1 week.  Discussed negative  CT findings with patient.  Patient reports moderate relief with IV interventions today.  Patient given discharge care instruction migraine headaches.  Patient advised follow-up PCP for continued care.  Patient received a prescription for Imitrex and a work note.      ____________________________________________   FINAL CLINICAL IMPRESSION(S) / ED DIAGNOSES  Final diagnoses:  Migraine without aura and with status migrainosus, not intractable     ED Discharge Orders         Ordered    SUMAtriptan (IMITREX) 50 MG tablet  Once PRN     04/02/18 1431           Note:  This document was prepared using Dragon voice recognition software and may include unintentional dictation errors.    Joni Reining, PA-C 04/02/18 1439    Sharyn Creamer, MD 04/02/18 2128

## 2018-04-02 NOTE — ED Triage Notes (Signed)
Pt stating "pounding headache" and dizziness. Pt stating sx have been happening for the last week or so but it has been getting much worse. Pt stating she has been taking Excedrin Migraine "but it is not working."

## 2018-04-02 NOTE — ED Notes (Signed)
Pt to STAT desk ambulating c/o "vertigo" and headache x 2 months. Pt states that she was sent home yesterday from work for flu like sx. Pt offered wheelchair, denies.

## 2018-04-04 ENCOUNTER — Emergency Department: Payer: 59

## 2018-04-04 ENCOUNTER — Encounter: Payer: Self-pay | Admitting: Emergency Medicine

## 2018-04-04 ENCOUNTER — Emergency Department
Admission: EM | Admit: 2018-04-04 | Discharge: 2018-04-04 | Disposition: A | Discharge status: Home / Self Care | Payer: 59 | Attending: Emergency Medicine | Admitting: Emergency Medicine

## 2018-04-04 ENCOUNTER — Other Ambulatory Visit: Payer: Self-pay

## 2018-04-04 DIAGNOSIS — G43109 Migraine with aura, not intractable, without status migrainosus: Secondary | ICD-10-CM | POA: Insufficient documentation

## 2018-04-04 DIAGNOSIS — H532 Diplopia: Secondary | ICD-10-CM | POA: Diagnosis not present

## 2018-04-04 DIAGNOSIS — J111 Influenza due to unidentified influenza virus with other respiratory manifestations: Secondary | ICD-10-CM | POA: Insufficient documentation

## 2018-04-04 DIAGNOSIS — N390 Urinary tract infection, site not specified: Secondary | ICD-10-CM | POA: Insufficient documentation

## 2018-04-04 DIAGNOSIS — G43909 Migraine, unspecified, not intractable, without status migrainosus: Secondary | ICD-10-CM | POA: Diagnosis not present

## 2018-04-04 DIAGNOSIS — G932 Benign intracranial hypertension: Secondary | ICD-10-CM

## 2018-04-04 DIAGNOSIS — R51 Headache: Secondary | ICD-10-CM | POA: Diagnosis present

## 2018-04-04 HISTORY — PX: LUMBAR PUNCTURE: SHX1985

## 2018-04-04 HISTORY — DX: Benign intracranial hypertension: G93.2

## 2018-04-04 LAB — URINALYSIS, COMPLETE (UACMP) WITH MICROSCOPIC
Bilirubin Urine: NEGATIVE
Glucose, UA: NEGATIVE mg/dL
Ketones, ur: 5 mg/dL — AB
Nitrite: NEGATIVE
Protein, ur: 30 mg/dL — AB
Specific Gravity, Urine: 1.028 (ref 1.005–1.030)
WBC, UA: >50 WBC/hpf — ABNORMAL HIGH (ref 0–5)
pH: 5 (ref 5.0–8.0)

## 2018-04-04 LAB — CBC WITH DIFFERENTIAL/PLATELET
Abs Immature Granulocytes: 0.01 10*3/uL (ref 0.00–0.07)
Basophils Absolute: 0 10*3/uL (ref 0.0–0.1)
Basophils Relative: 0 %
EOS PCT: 2 %
Eosinophils Absolute: 0.1 10*3/uL (ref 0.0–0.5)
HCT: 40 % (ref 36.0–46.0)
Hemoglobin: 12.7 g/dL (ref 12.0–15.0)
Immature Granulocytes: 0 %
Lymphocytes Relative: 20 %
Lymphs Abs: 1.1 10*3/uL (ref 0.7–4.0)
MCH: 29.2 pg (ref 26.0–34.0)
MCHC: 31.8 g/dL (ref 30.0–36.0)
MCV: 92 fL (ref 80.0–100.0)
Monocytes Absolute: 0.4 10*3/uL (ref 0.1–1.0)
Monocytes Relative: 7 %
NRBC: 0 % (ref 0.0–0.2)
Neutro Abs: 3.8 10*3/uL (ref 1.7–7.7)
Neutrophils Relative %: 71 %
Platelets: 261 10*3/uL (ref 150–400)
RBC: 4.35 MIL/uL (ref 3.87–5.11)
RDW: 12.7 % (ref 11.5–15.5)
WBC: 5.4 10*3/uL (ref 4.0–10.5)

## 2018-04-04 LAB — COMPREHENSIVE METABOLIC PANEL
ALT: 17 U/L (ref 0–44)
AST: 24 U/L (ref 15–41)
Albumin: 4.2 g/dL (ref 3.5–5.0)
Alkaline Phosphatase: 55 U/L (ref 38–126)
Anion gap: 8 (ref 5–15)
BUN: 8 mg/dL (ref 6–20)
CO2: 21 mmol/L — ABNORMAL LOW (ref 22–32)
CREATININE: 0.7 mg/dL (ref 0.44–1.00)
Calcium: 8.2 mg/dL — ABNORMAL LOW (ref 8.9–10.3)
Chloride: 107 mmol/L (ref 98–111)
GFR calc Af Amer: >60 mL/min (ref >60)
Glucose, Bld: 167 mg/dL — ABNORMAL HIGH (ref 70–99)
Potassium: 3.6 mmol/L (ref 3.5–5.1)
Sodium: 136 mmol/L (ref 135–145)
Total Bilirubin: 0.3 mg/dL (ref 0.3–1.2)
Total Protein: 7.5 g/dL (ref 6.5–8.1)

## 2018-04-04 LAB — INFLUENZA PANEL BY PCR (TYPE A & B)
Influenza A By PCR: NEGATIVE
Influenza B By PCR: POSITIVE — AB

## 2018-04-04 MED ORDER — IBUPROFEN 800 MG PO TABS
800.0000 mg | ORAL_TABLET | Freq: Once | ORAL | Status: AC
  Administered 2018-04-04: 800 mg via ORAL
  Filled 2018-04-04: qty 1

## 2018-04-04 MED ORDER — OSELTAMIVIR PHOSPHATE 75 MG PO CAPS
75.0000 mg | ORAL_CAPSULE | Freq: Once | ORAL | Status: AC
  Administered 2018-04-04: 75 mg via ORAL
  Filled 2018-04-04: qty 1

## 2018-04-04 MED ORDER — SODIUM CHLORIDE 0.9 % IV BOLUS
1000.0000 mL | Freq: Once | INTRAVENOUS | Status: AC
  Administered 2018-04-04: 1000 mL via INTRAVENOUS

## 2018-04-04 MED ORDER — OSELTAMIVIR PHOSPHATE 75 MG PO CAPS: 75.0000 mg | ORAL_CAPSULE | Freq: Two times a day (BID) | ORAL | 0 refills | Status: AC

## 2018-04-04 MED ORDER — CEPHALEXIN 500 MG PO CAPS
500.0000 mg | ORAL_CAPSULE | Freq: Once | ORAL | Status: AC
  Administered 2018-04-04: 500 mg via ORAL
  Filled 2018-04-04: qty 1

## 2018-04-04 MED ORDER — ACETAMINOPHEN 500 MG PO TABS
1000.0000 mg | ORAL_TABLET | Freq: Once | ORAL | Status: AC
  Administered 2018-04-04: 1000 mg via ORAL
  Filled 2018-04-04: qty 2

## 2018-04-04 MED ORDER — PROCHLORPERAZINE EDISYLATE 10 MG/2ML IJ SOLN
10.0000 mg | Freq: Once | INTRAMUSCULAR | Status: AC
  Administered 2018-04-04: 10 mg via INTRAVENOUS
  Filled 2018-04-04: qty 2

## 2018-04-04 MED ORDER — CEPHALEXIN 500 MG PO CAPS: 500.0000 mg | ORAL_CAPSULE | Freq: Three times a day (TID) | ORAL | 0 refills | Status: AC

## 2018-04-04 NOTE — ED Provider Notes (Signed)
[facility] Emergency Department Provider Note   ____________________________________________   First MD Initiated Contact with Patient 04/04/18 [phone]     (approximate)  I have reviewed the triage vital signs and the nursing notes.   HISTORY  Chief Complaint vision changes    HPI Kimberly Lloyd is a 28 y.o. female who has a family history of migraines.  She began having migraines 2 months ago.  They are pulsing headache in the top of her head.  Usually starts with her vision tunneling in and then becoming fuzzy getting a fuzzy feeling in her ears and then the headache starts.  Yesterday she had an episode of double vision which she is never had before.  She got the headache under control is only 2 out of 10 but she continues to have double vision.   Past Medical History:  Diagnosis Date  . Complication of anesthesia    GETS AGGRESSIVE   . Endometriosis     There are no active problems to display for this patient.   Past Surgical History:  Procedure Laterality Date  . CYST EXCISION    . DIAGNOSTIC LAPAROSCOPY    . ORIF ANKLE FRACTURE Left 09/07/2015   Procedure: OPEN REDUCTION INTERNAL FIXATION (ORIF) ANKLE FRACTURE;  Surgeon: Kennedy Bucker, MD;  Location: [facility] ORS;  Service: Orthopedics;  Laterality: Left;  . TONSILLECTOMY    . TONSILLECTOMY AND ADENOIDECTOMY      Prior to Admission medications   Medication Sig Start Date End Date Taking? Authorizing Provider  aspirin-acetaminophen-caffeine (EXCEDRIN MIGRAINE) [phone] MG tablet Take 2 tablets by mouth 3 (three) times daily as needed for headache or migraine.   Yes [provider]  SUMAtriptan (IMITREX) 50 MG tablet Take 1 tablet (50 mg total) by mouth once as needed for migraine. May repeat in 2 hours if headache persists or recurs. 04/02/18 04/03/19 Yes Joni Reining, PA-C  cephALEXin (KEFLEX) 500 MG capsule Take 1 capsule (500 mg total) by mouth 3 (three) times daily for 10 days.  04/04/18 04/14/18  Arnaldo Natal, MD  Elagolix Sodium (ORILISSA) 150 MG TABS Take 1 tablet by mouth daily. Patient not taking: Reported on 04/04/2018 03/19/18   Vena Austria, MD  levonorgestrel (MIRENA, 52 MG,) 20 MCG/24HR IUD 1 Intra Uterine Device (1 each total) once for 1 dose by Intrauterine route. 01/16/17 01/16/17  Copland, Ilona Sorrel, PA-C  oseltamivir (TAMIFLU) 75 MG capsule Take 1 capsule (75 mg total) by mouth 2 (two) times daily for 5 days. 04/04/18 04/09/18  Arnaldo Natal, MD    Allergies Septra [sulfamethoxazole-trimethoprim]  Family History  Problem Relation Age of Onset  . Healthy Mother   . Healthy Father   . Healthy Brother   . Atrial fibrillation Maternal Grandmother   . Lung cancer Paternal Grandmother   . Lung cancer Paternal Grandfather   . Sudden death Brother   . Breast cancer Other     Social History Social History   Tobacco Use  . Smoking status: Never Smoker  . Smokeless tobacco: Never Used  Substance Use Topics  . Alcohol use: Not Currently    Comment: possibly 1 x per year  . Drug use: No    Review of Systems  Constitutional: No fever/chills Eyes: No visual changes. ENT: No sore throat. Cardiovascular: Denies chest pain. Respiratory: Denies shortness of breath. Gastrointestinal: No abdominal pain.  No nausea, no vomiting.  No diarrhea.  No constipation. Genitourinary: Negative for dysuria. Musculoskeletal: Negative for back pain. Skin:  Negative for rash. Neurological: Negative for headaches, focal weakness  ____________________________________________   PHYSICAL EXAM:  VITAL SIGNS: ED Triage Vitals  Enc Vitals Group     BP 04/04/18 0920 130/87     Pulse Rate 04/04/18 0920 (!) 111     Resp 04/04/18 0920 16     Temp 04/04/18 0920 98.3 F (36.8 C)     Temp Source 04/04/18 0920 Oral     SpO2 04/04/18 0920 96 %     Weight 04/04/18 0921 208 lb (94.3 kg)     Height 04/04/18 0921 5\' 5"  (1.651 m)     Head Circumference --      Peak Flow  --      Pain Score 04/04/18 0921 0     Pain Loc --      Pain Edu? --      Excl. in GC? --     Constitutional: Alert and oriented. Well appearing and in no acute distress. Eyes: Conjunctivae are normal. PERRL. EOMI. fundi look normal Head: Atraumatic. Nose: No congestion/rhinnorhea. Mouth/Throat: Mucous membranes are moist.  Oropharynx non-erythematous. Neck: No stridor. Cardiovascular: Normal rate, regular rhythm. Grossly normal heart sounds.  Good peripheral circulation. Respiratory: Normal respiratory effort.  No retractions. Lungs CTAB. Gastrointestinal: Soft and nontender. No distention. No abdominal bruits. No CVA tenderness. Musculoskeletal: No lower extremity tenderness nor edema.  No joint effusions. Neurologic:  Normal speech and language. No gross focal neurologic deficits are appreciated.  Cranial nerves II through XII are intact of the visual fields were not checked patient does report double vision.  Cerebellar finger-nose rapid alternating movements and hands are normal patient does not report any numbness motor strength is 5/5 throughout.  No gait instability. Skin:  Skin is warm, dry and intact. No rash noted. Psychiatric: Mood and affect are normal. Speech and behavior are normal.  ____________________________________________   LABS (all labs ordered are listed, but only abnormal results are displayed)  Labs Reviewed  COMPREHENSIVE METABOLIC PANEL - Abnormal; Notable for the following components:      Result Value   CO2 21 (*)    Glucose, Bld 167 (*)    Calcium 8.2 (*)    All other components within normal limits  URINALYSIS, COMPLETE (UACMP) WITH MICROSCOPIC - Abnormal; Notable for the following components:   Color, Urine YELLOW (*)    APPearance CLOUDY (*)    Hgb urine dipstick SMALL (*)    Ketones, ur 5 (*)    Protein, ur 30 (*)    Leukocytes, UA LARGE (*)    WBC, UA >50 (*)    Bacteria, UA FEW (*)    All other components within normal limits  INFLUENZA  PANEL BY PCR (TYPE A & B) - Abnormal; Notable for the following components:   Influenza B By PCR POSITIVE (*)    All other components within normal limits  CBC WITH DIFFERENTIAL/PLATELET   ____________________________________________  EKG   ____________________________________________  RADIOLOGY  ED MD interpretation:    Official radiology report(s): Mr Brain Wo Contrast  Result Date: 04/04/2018 CLINICAL DATA:  Migraines for 2 months. More recently, vertigo with double vision. EXAM: MRI HEAD WITHOUT CONTRAST TECHNIQUE: Multiplanar, multiecho pulse sequences of the brain and surrounding structures were obtained without intravenous contrast. COMPARISON:  CT head 04/02/2018. FINDINGS: Brain: No evidence for acute infarction, hemorrhage, mass lesion, hydrocephalus, or extra-axial fluid. Normal cerebral volume. No white matter disease. Vascular: Normal flow voids. Skull and upper cervical spine: Normal marrow signal. Sinuses/Orbits: Negative. Other: None.  IMPRESSION: Negative exam.  No change from recent normal CT. Electronically Signed   By: Elsie StainJohn T Curnes M.D.   On: 04/04/2018 11:19    ____________________________________________   PROCEDURES  Proc  Procedures  Critical Care performed:   ____________________________________________   INITIAL IMPRESSION / ASSESSMENT AND PLAN / ED COURSE  ----------------------------------------- 2:01 PM on 04/04/2018 -----------------------------------------  Patient reports visual changes are better headaches better I discussed the patient with Dr. Thad Rangereynolds neurology who feels that the normal MRI means that the double vision is probably an aura from the migraine.  We will treat her for the flu and the UTI and refer her to neurology outpatient for further treatment of her pain.  She will return if she is worse         ____________________________________________   FINAL CLINICAL IMPRESSION(S) / ED DIAGNOSES  Final diagnoses:  Migraine  with aura and without status migrainosus, not intractable  Influenza  Urinary tract infection without hematuria, site unspecified     ED Discharge Orders         Ordered    cephALEXin (KEFLEX) 500 MG capsule  3 times daily     04/04/18 1403    oseltamivir (TAMIFLU) 75 MG capsule  2 times daily     04/04/18 1403           Note:  This document was prepared using Dragon voice recognition software and may include unintentional dictation errors.    Arnaldo NatalMalinda, Claudett Bayly F, MD 04/04/18 [phone]

## 2018-04-04 NOTE — ED Triage Notes (Signed)
Pt has had trouble with migraines for 2 months.  Along with migraines for past month has had what she describes as tunnel vision.  For last 2 days had vertigo problems.  Starting yesterday began with double vision in both eyes.  Woke up with migraine took Excedrin migraine, sudafed, and meclizine and migraine went away but vision still seems double.  Vertigo went away.  Seen here last Thursday and had CT scan.  Reports imitrex does not help with pain and OTC meds help more.  Came today because double vision did not go away.

## 2018-04-04 NOTE — ED Notes (Signed)
Patient transported to MRI 

## 2018-04-04 NOTE — Discharge Instructions (Addendum)
Please take the Tamiflu 1 twice a day to help with the flu.  Please take the Keflex 1 3 times a day to help with the UTI.  Please return for worsening headache or double vision.  Remember that neurologist and I feel that the migraine should get better once the fluid in the UTI get better.  I will have you follow-up with neurology outpatient.  Please call either Dr. Malvin Johns or Dr. Sherryll Burger for outpatient follow-up.  Let them know about the migraines and the double vision and that you were seen in the emergency room that may help you get in sooner.  Please return here if anything gets worse or if you have any migraine symptoms or does not get better or resolved with treatment.  These also follow-up with your regular doctor

## 2018-04-07 DIAGNOSIS — H5213 Myopia, bilateral: Secondary | ICD-10-CM | POA: Diagnosis not present

## 2018-04-07 DIAGNOSIS — H532 Diplopia: Secondary | ICD-10-CM | POA: Diagnosis not present

## 2018-04-07 DIAGNOSIS — R51 Headache: Secondary | ICD-10-CM | POA: Diagnosis not present

## 2018-04-07 DIAGNOSIS — H93A3 Pulsatile tinnitus, bilateral: Secondary | ICD-10-CM | POA: Diagnosis not present

## 2018-04-08 ENCOUNTER — Ambulatory Visit: Payer: 59 | Admitting: Obstetrics and Gynecology

## 2018-04-08 ENCOUNTER — Other Ambulatory Visit: Payer: Self-pay | Admitting: Neurology

## 2018-04-08 ENCOUNTER — Ambulatory Visit
Admission: RE | Admit: 2018-04-08 | Discharge: 2018-04-08 | Disposition: A | Discharge status: Home / Self Care | Payer: 59 | Source: Ambulatory Visit | Attending: Neurology | Admitting: Neurology

## 2018-04-08 DIAGNOSIS — R51 Headache: Secondary | ICD-10-CM | POA: Insufficient documentation

## 2018-04-08 DIAGNOSIS — H532 Diplopia: Secondary | ICD-10-CM | POA: Diagnosis not present

## 2018-04-08 DIAGNOSIS — R519 Headache, unspecified: Secondary | ICD-10-CM

## 2018-04-09 ENCOUNTER — Other Ambulatory Visit: Payer: Self-pay | Admitting: Neurology

## 2018-04-09 DIAGNOSIS — G932 Benign intracranial hypertension: Secondary | ICD-10-CM

## 2018-04-09 DIAGNOSIS — H532 Diplopia: Secondary | ICD-10-CM | POA: Insufficient documentation

## 2018-04-09 DIAGNOSIS — H93A3 Pulsatile tinnitus, bilateral: Secondary | ICD-10-CM | POA: Insufficient documentation

## 2018-04-09 DIAGNOSIS — H4710 Unspecified papilledema: Secondary | ICD-10-CM

## 2018-04-10 ENCOUNTER — Ambulatory Visit
Admission: RE | Admit: 2018-04-10 | Discharge: 2018-04-10 | Disposition: A | Discharge status: Home / Self Care | Payer: 59 | Source: Ambulatory Visit | Attending: Neurology | Admitting: Neurology

## 2018-04-10 DIAGNOSIS — H4710 Unspecified papilledema: Secondary | ICD-10-CM | POA: Insufficient documentation

## 2018-04-10 DIAGNOSIS — G932 Benign intracranial hypertension: Secondary | ICD-10-CM | POA: Insufficient documentation

## 2018-04-10 LAB — APTT: aPTT: 31 seconds (ref 24–36)

## 2018-04-10 LAB — PROTIME-INR
INR: 1.1
Prothrombin Time: 14.1 seconds (ref 11.4–15.2)

## 2018-04-10 LAB — POCT PREGNANCY, URINE: PREG TEST UR: NEGATIVE

## 2018-04-10 MED ORDER — ACETAMINOPHEN 325 MG PO TABS
650.0000 mg | ORAL_TABLET | ORAL | Status: DC | PRN
  Filled 2018-04-10: qty 2

## 2018-04-10 NOTE — OR Nursing (Signed)
Via wheelchair to front lobby by [employer] for disharge to home with mom [phone].

## 2018-04-10 NOTE — OR Nursing (Signed)
Dr. Register in to speak with pt and family member, advises ok to discharge 2 hrs (1pm)

## 2018-04-10 NOTE — OR Nursing (Addendum)
To bathroom for void, back to room, remains flat in bed.  Mom requests pt to stay 4 hrs instead of 2.  Told her and patient may do so.  Pt remains without headache and has had lunch.  Pt/mom instructed for patient to remain as flat as possible for the remainder of the day by Dr. Register, and reinforced by observing nurse post procedure.  Remained without headache and bandaid to back dry and intact during post procedure observation.

## 2018-04-10 NOTE — Progress Notes (Signed)
Pt stable after lp.Back stable.D/C instructions given F/U with her MD. 

## 2018-04-16 ENCOUNTER — Ambulatory Visit: Payer: 59 | Admitting: Obstetrics and Gynecology

## 2018-04-16 DIAGNOSIS — G932 Benign intracranial hypertension: Secondary | ICD-10-CM | POA: Diagnosis not present

## 2018-04-21 DIAGNOSIS — G932 Benign intracranial hypertension: Secondary | ICD-10-CM | POA: Insufficient documentation

## 2018-04-22 DIAGNOSIS — G932 Benign intracranial hypertension: Secondary | ICD-10-CM | POA: Diagnosis not present

## 2018-04-22 DIAGNOSIS — H532 Diplopia: Secondary | ICD-10-CM | POA: Diagnosis not present

## 2018-04-28 ENCOUNTER — Other Ambulatory Visit: Payer: Self-pay | Admitting: Neurology

## 2018-04-28 DIAGNOSIS — G932 Benign intracranial hypertension: Secondary | ICD-10-CM

## 2018-04-29 ENCOUNTER — Ambulatory Visit
Admission: RE | Admit: 2018-04-29 | Discharge: 2018-04-29 | Disposition: A | Discharge status: Home / Self Care | Payer: 59 | Source: Ambulatory Visit | Attending: Neurology | Admitting: Neurology

## 2018-04-29 DIAGNOSIS — G932 Benign intracranial hypertension: Secondary | ICD-10-CM | POA: Insufficient documentation

## 2018-04-29 LAB — POCT PREGNANCY, URINE: Preg Test, Ur: NEGATIVE

## 2018-04-29 LAB — CBC
HEMATOCRIT: 35.8 % — AB (ref 36.0–46.0)
Hemoglobin: 11.7 g/dL — ABNORMAL LOW (ref 12.0–15.0)
MCH: 29.8 pg (ref 26.0–34.0)
MCHC: 32.7 g/dL (ref 30.0–36.0)
MCV: 91.1 fL (ref 80.0–100.0)
Platelets: 359 10*3/uL (ref 150–400)
RBC: 3.93 MIL/uL (ref 3.87–5.11)
RDW: 12.7 % (ref 11.5–15.5)
WBC: 7.5 10*3/uL (ref 4.0–10.5)
nRBC: 0 % (ref 0.0–0.2)

## 2018-04-29 LAB — PROTIME-INR
INR: 1 (ref 0.8–1.2)
Prothrombin Time: 13.1 seconds (ref 11.4–15.2)

## 2018-04-29 LAB — APTT: aPTT: 32 seconds (ref 24–36)

## 2018-04-29 MED ORDER — ACETAMINOPHEN 325 MG PO TABS
650.0000 mg | ORAL_TABLET | ORAL | Status: DC | PRN
  Filled 2018-04-29: qty 2

## 2018-04-29 MED ORDER — LIDOCAINE HCL (PF) 1 % IJ SOLN
30.0000 mL | Freq: Once | INTRAMUSCULAR | Status: AC
  Administered 2018-04-29: 15 mL
  Filled 2018-04-29: qty 30

## 2018-04-29 NOTE — Discharge Instructions (Signed)
Lumbar Puncture, Care After  This sheet gives you information about how to care for yourself after your procedure. Your health care provider may also give you more specific instructions. If you have problems or questions, contact your health care provider.  What can I expect after the procedure?  After the procedure, it is common to have:   Mild discomfort or pain at the puncture site.   A mild headache that is relieved with pain medicines.  Follow these instructions at home:  Activity     Lie down flat or rest for as long as directed by your health care provider.   Return to your normal activities as told by your health care provider. Ask your health care provider what activities are safe for you.   Avoid lifting anything heavier than 10 lb (4.5 kg) for at least 12 hours after the procedure.   Do not drive for 24 hours if you were given a medicine to help you relax (sedative) during your procedure.   Do not drive or use heavy machinery while taking prescription pain medicine.  Puncture site care   Remove or change your bandage (dressing) as told by your health care provider.   Check your puncture area every day for signs of infection. Check for:  ? More pain.  ? Redness or swelling.  ? Fluid or blood leaking from the puncture site.  ? Warmth.  ? Pus or a bad smell.  General instructions   Take over-the-counter and prescription medicines only as told by your health care provider.   Drink enough fluids to keep your urine clear or pale yellow. Your health care provider may recommend drinking caffeine to prevent a headache.   Keep all follow-up visits as told by your health care provider. This is important.  Contact a health care provider if:   You have fever or chills.   You have nausea or vomiting.   You have a headache that lasts for more than 2 days or does not get better with medicine.  Get help right away if:   You develop any of the following in your  legs:  ? Weakness.  ? Numbness.  ? Tingling.   You are unable to control when you urinate or have a bowel movement (incontinence).   You have signs of infection around your puncture site, such as:  ? More pain.  ? Redness or swelling.  ? Fluid or blood leakage.  ? Warmth.  ? Pus or a bad smell.   You are dizzy or you feel like you might faint.   You have a severe headache, especially when you sit or stand.  Summary   A lumbar puncture is a procedure in which a small needle is inserted into the lower back to remove fluid that surrounds the brain and spinal cord.   After this procedure, it is common to have a headache and pain around the needle insertion area.   Lying flat, staying hydrated, and drinking caffeine can help prevent headaches.   Monitor your needle insertion site for signs of infection, including warmth, fluid, or more pain.   Get help right away if you develop leg weakness, leg numbness, incontinence, or severe headaches.  This information is not intended to replace advice given to you by your health care provider. Make sure you discuss any questions you have with your health care provider.  Document Released: 02/23/2013 Document Revised: 04/03/2016 Document Reviewed: 04/03/2016  Elsevier Interactive Patient Education  2019 Elsevier Inc.

## 2018-04-29 NOTE — Procedures (Signed)
Lumbar puncture performed at L2 level to assess opening pressure in patient with known pseudotumor cerebri. No complications.  See dictation.

## 2018-05-12 DIAGNOSIS — R519 Headache, unspecified: Secondary | ICD-10-CM | POA: Insufficient documentation

## 2018-05-12 DIAGNOSIS — R51 Headache: Secondary | ICD-10-CM | POA: Diagnosis not present

## 2018-05-12 DIAGNOSIS — G932 Benign intracranial hypertension: Secondary | ICD-10-CM | POA: Diagnosis not present

## 2018-05-15 DIAGNOSIS — G932 Benign intracranial hypertension: Secondary | ICD-10-CM | POA: Diagnosis not present

## 2018-05-15 DIAGNOSIS — H532 Diplopia: Secondary | ICD-10-CM | POA: Diagnosis not present

## 2018-07-07 DIAGNOSIS — G932 Benign intracranial hypertension: Secondary | ICD-10-CM | POA: Diagnosis not present

## 2018-07-13 DIAGNOSIS — G932 Benign intracranial hypertension: Secondary | ICD-10-CM | POA: Diagnosis not present

## 2018-07-30 ENCOUNTER — Other Ambulatory Visit: Payer: Self-pay

## 2018-07-30 ENCOUNTER — Ambulatory Visit: Payer: 59 | Attending: Nurse Practitioner | Admitting: Physical Therapy

## 2018-07-30 DIAGNOSIS — M25512 Pain in left shoulder: Secondary | ICD-10-CM | POA: Insufficient documentation

## 2018-07-30 NOTE — Therapy (Signed)
[place] [facility] MAIN [facility] SERVICES [address] [place], [place], [address] Phone: [phone]   Fax:  [phone]  Physical Therapy Evaluation  Patient Details  Name: Kimberly Lloyd MRN: [id number] Date of Birth: [birth year] No data recorded  Encounter Date: 07/30/2018    Past Medical History:  Diagnosis Date  . Complication of anesthesia    GETS AGGRESSIVE   . Endometriosis     Past Surgical History:  Procedure Laterality Date  . CYST EXCISION    . DIAGNOSTIC LAPAROSCOPY    . ORIF ANKLE FRACTURE Left 09/07/2015   Procedure: OPEN REDUCTION INTERNAL FIXATION (ORIF) ANKLE FRACTURE;  Surgeon: Kennedy Bucker, MD;  Location: [facility] ORS;  Service: Orthopedics;  Laterality: Left;  . TONSILLECTOMY    . TONSILLECTOMY AND ADENOIDECTOMY      There were no vitals filed for this visit.     PT/OT/SLP Screening Form   Time: in   3:00  Time out 3:30   Complaint  Patient has had left shoulder pain during lying down and when she crosses her left shoulder across her body. It hurts with certain positions and the pain 0/10 - 3/10. The pain is at night and it wakes her up at night. She does not take any medicine for it.  Past Medical Hx:  Idiopathic intercrainal hypertention , double vision, She had 2 lumbar punctures in 2/20. Tibia fx right 6/17.  Injury Date: 06/30/18 patient noticed her left shoulder began to hurt and it is getting worse   Pain Scale: 0/10-3/10  Patient's phone number: [phone]  Hx (this occurrence): She has been having shoulder pain for 3-4 weeks and it is intermittent pain that is positional .     Assessment: AROM L shoulder : Left shoulder pain ful arc 105-120 deg  Left shoulder flex no pain  Left shoulder ER/IR WNL PROM: Flex /abd/IR/ER WNL and no pain  Myotomes and dermatomes WNL left shoulder  Accessory movements left shoulder WNL Accessory mobility T 2- T6 hypo mobile and painful  Strength testing : Left and  right shoulder WNL   Palpation : Painful to left upper trap/ rhomboids/ supraspinatus muscle    Recommendations:    Comments Instructed in shoulder depression exercises, scapula retraction with GTB, heat and ice to thoracic spine:    []  Patient would benefit from an MD referral []  Patient would benefit from a full PT/OT/ SLP evaluation and treatment. [x]  No intervention recommended at this time.               Objective measurements completed on examination: See above findings.                             Patient will benefit from skilled therapeutic intervention in order to improve the following deficits and impairments:     Visit Diagnosis: Acute pain of left shoulder     Problem List There are no active problems to display for this patient.   [address] [place] DPT 07/30/2018, 3:06 PM   [facility] MAIN [facility] SERVICES [address] [place], [place], [address] Phone: [phone]   Fax:  [phone]  Name: Kimberly Lloyd MRN: [id number] Date of Birth: Dec 09, 1990

## 2018-08-10 ENCOUNTER — Encounter: Payer: Self-pay | Admitting: Obstetrics and Gynecology

## 2018-08-10 ENCOUNTER — Ambulatory Visit (INDEPENDENT_AMBULATORY_CARE_PROVIDER_SITE_OTHER): Payer: 59 | Admitting: Obstetrics and Gynecology

## 2018-08-10 ENCOUNTER — Other Ambulatory Visit (HOSPITAL_COMMUNITY)
Admission: RE | Admit: 2018-08-10 | Discharge: 2018-08-10 | Disposition: A | Discharge status: Home / Self Care | Payer: 59 | Source: Ambulatory Visit | Attending: Obstetrics and Gynecology | Admitting: Obstetrics and Gynecology

## 2018-08-10 ENCOUNTER — Other Ambulatory Visit: Payer: Self-pay

## 2018-08-10 VITALS — BP 120/80 | Ht 65.0 in | Wt 206.6 lb

## 2018-08-10 DIAGNOSIS — B9689 Other specified bacterial agents as the cause of diseases classified elsewhere: Secondary | ICD-10-CM

## 2018-08-10 DIAGNOSIS — Z113 Encounter for screening for infections with a predominantly sexual mode of transmission: Secondary | ICD-10-CM | POA: Diagnosis not present

## 2018-08-10 DIAGNOSIS — N760 Acute vaginitis: Secondary | ICD-10-CM

## 2018-08-10 LAB — POCT WET PREP WITH KOH
KOH Prep POC: POSITIVE — AB
Trichomonas, UA: NEGATIVE
Yeast Wet Prep HPF POC: NEGATIVE

## 2018-08-10 MED ORDER — METRONIDAZOLE 500 MG PO TABS: 500.0000 mg | ORAL_TABLET | Freq: Two times a day (BID) | ORAL | 0 refills | Status: AC

## 2018-08-10 NOTE — Patient Instructions (Signed)
I value your feedback and entrusting us with your care. If you get a [facility] patient survey, I would appreciate you taking the time to let us know about your experience today. Thank you! 

## 2018-08-10 NOTE — Progress Notes (Signed)
Kimberly [employer], NP   Chief Complaint  Patient presents with  . Vaginal Odor    discharge, fishy odor, no itchiness/irritation x 4 days    HPI:      [name] is a 28 y.o. No obstetric history on file. who LMP was No LMP recorded. (Menstrual status: IUD)., presents today for increased d/c with bad odor, no irritation, for 4 days. No hx of BV. Was on abx 2/20. No urin sx, LBP, belly pain, fevers. She is sex active with new partner. Wants STD testing. Has BTB with IUD.  Annual done 1/20.   Past Medical History:  Diagnosis Date  . Complication of anesthesia    GETS AGGRESSIVE   . Endometriosis   . Idiopathic intracranial hypertension 04/2018    Past Surgical History:  Procedure Laterality Date  . CYST EXCISION    . DIAGNOSTIC LAPAROSCOPY    . ORIF ANKLE FRACTURE Left 09/07/2015   Procedure: OPEN REDUCTION INTERNAL FIXATION (ORIF) ANKLE FRACTURE;  Surgeon: Hessie Knows, MD;  Location: [facility] ORS;  Service: Orthopedics;  Laterality: Left;  . TONSILLECTOMY    . TONSILLECTOMY AND ADENOIDECTOMY      Family History  Problem Relation Age of Onset  . Healthy Mother   . Healthy Father   . Healthy Brother   . Atrial fibrillation Maternal Grandmother   . Lung cancer Paternal Grandmother   . Lung cancer Paternal Grandfather   . Sudden death Brother   . Breast cancer Other     Social History   Socioeconomic History  . Marital status: Single    Spouse name: Not on file  . Number of children: Not on file  . Years of education: Not on file  . Highest education level: Bachelor's degree (e.g., BA, AB, BS)  Occupational History  . Occupation: Equities trader  Social Needs  . Financial resource strain: Not hard at all  . Food insecurity:    Worry: Never true    Inability: Never true  . Transportation needs:    Medical: No    Non-medical: No  Tobacco Use  . Smoking status: Never Smoker  . Smokeless tobacco: Never Used  Substance and Sexual Activity  .  Alcohol use: Not Currently    Comment: possibly 1 x per year  . Drug use: No  . Sexual activity: Yes    Birth control/protection: I.U.D.    Comment: Mirena  Lifestyle  . Physical activity:    Days per week: 4 days    Minutes per session: 60 min  . Stress: Not at all  Relationships  . Social connections:    Talks on phone: More than three times a week    Gets together: Once a week    Attends religious service: More than 4 times per year    Active member of club or organization: Yes    Attends meetings of clubs or organizations: More than 4 times per year    Relationship status: Divorced  . Intimate partner violence:    Fear of current or ex partner: No    Emotionally abused: No    Physically abused: No    Forced sexual activity: No  Other Topics Concern  . Not on file  Social History Narrative  . Not on file    Outpatient Medications Prior to Visit  Medication Sig Dispense Refill  . acetaZOLAMIDE (DIAMOX) 500 MG capsule Take by mouth.    Marland Kitchen aspirin-acetaminophen-caffeine (EXCEDRIN MIGRAINE) 250-250-65 MG tablet Take 2  tablets by mouth 3 (three) times daily as needed for headache or migraine.    . topiramate (TOPAMAX) 25 MG tablet Take by mouth.    . levonorgestrel (MIRENA, 52 MG,) 20 MCG/24HR IUD 1 Intra Uterine Device (1 each total) once for 1 dose by Intrauterine route. 1 Intra Uterine Device 0  . Elagolix Sodium (ORILISSA) 150 MG TABS Take 1 tablet by mouth daily. (Patient not taking: Reported on 04/04/2018) 30 tablet 5  . SUMAtriptan (IMITREX) 50 MG tablet Take 1 tablet (50 mg total) by mouth once as needed for migraine. May repeat in 2 hours if headache persists or recurs. 30 tablet 2   No facility-administered medications prior to visit.       ROS:  Review of Systems  Constitutional: Negative for fever.  Gastrointestinal: Negative for blood in stool, constipation, diarrhea, nausea and vomiting.  Genitourinary: Positive for vaginal discharge. Negative for  dyspareunia, dysuria, flank pain, frequency, hematuria, urgency, vaginal bleeding and vaginal pain.  Musculoskeletal: Negative for back pain.  Skin: Negative for rash.   BREAST: No symptoms   OBJECTIVE:   Vitals:  BP 120/80   Ht 5\' 5"  (1.651 m)   Wt 206 lb 9.6 oz (93.7 kg)   BMI 34.38 kg/m   Physical Exam Vitals signs reviewed.  Constitutional:      Appearance: She is well-developed.  Neck:     Musculoskeletal: Normal range of motion.  Pulmonary:     Effort: Pulmonary effort is normal.  Genitourinary:    General: Normal vulva.     Pubic Area: No rash.      Labia:        Right: No rash, tenderness or lesion.        Left: No rash, tenderness or lesion.      Vagina: Normal. No vaginal discharge, erythema or tenderness.     Cervix: Normal.     Uterus: Normal. Not enlarged and not tender.      Adnexa: Right adnexa normal and left adnexa normal.       Right: No mass or tenderness.         Left: No mass or tenderness.    Musculoskeletal: Normal range of motion.  Skin:    General: Skin is warm and dry.  Neurological:     General: No focal deficit present.     Mental Status: She is alert and oriented to person, place, and time.  Psychiatric:        Mood and Affect: Mood normal.        Behavior: Behavior normal.        Thought Content: Thought content normal.        Judgment: Judgment normal.     Results: Results for orders placed or performed in visit on 08/10/18 (from the past 24 hour(s))  POCT Wet Prep with KOH     Status: Abnormal   Collection Time: 08/10/18 11:12 AM  Result Value Ref Range   Trichomonas, UA Negative    Clue Cells Wet Prep HPF POC few    Epithelial Wet Prep HPF POC     Yeast Wet Prep HPF POC neg    Bacteria Wet Prep HPF POC     RBC Wet Prep HPF POC     WBC Wet Prep HPF POC     KOH Prep POC Positive (A) Negative     Assessment/Plan: Bacterial vaginosis - Pos sx/wet prep. Rx flagyl. No EtOH. Will RF if sx recur.  - Plan: POCT Wet Prep  with  KOH, metroNIDAZOLE (FLAGYL) 500 MG tablet  Screening for STD (sexually transmitted disease) - Plan: Cervicovaginal ancillary only    Meds ordered this encounter  Medications  . metroNIDAZOLE (FLAGYL) 500 MG tablet    Sig: Take 1 tablet (500 mg total) by mouth 2 (two) times daily for 7 days.    Dispense:  14 tablet    Refill:  0    Order Specific Question:   Supervising Provider    Answer:   Nadara MustardHARRIS, ROBERT P [[id number]]      Return if symptoms worsen or fail to improve.  Onyekachi Gathright B. Dolly Harbach, PA-C 08/10/2018 11:13 AM

## 2018-08-13 LAB — CERVICOVAGINAL ANCILLARY ONLY
Chlamydia: NEGATIVE
Neisseria Gonorrhea: NEGATIVE
Trichomonas: NEGATIVE

## 2018-08-13 NOTE — Progress Notes (Signed)
Pt aware.

## 2018-08-13 NOTE — Progress Notes (Signed)
Pls let pt know STD testing neg. Thx.

## 2018-09-23 DIAGNOSIS — G932 Benign intracranial hypertension: Secondary | ICD-10-CM | POA: Diagnosis not present

## 2018-10-19 DIAGNOSIS — R51 Headache: Secondary | ICD-10-CM | POA: Diagnosis not present

## 2018-10-19 DIAGNOSIS — G932 Benign intracranial hypertension: Secondary | ICD-10-CM | POA: Diagnosis not present

## 2019-01-19 DIAGNOSIS — R519 Headache, unspecified: Secondary | ICD-10-CM | POA: Diagnosis not present

## 2019-01-19 DIAGNOSIS — G932 Benign intracranial hypertension: Secondary | ICD-10-CM | POA: Diagnosis not present

## 2019-02-04 DIAGNOSIS — G932 Benign intracranial hypertension: Secondary | ICD-10-CM | POA: Diagnosis not present

## 2019-02-04 DIAGNOSIS — H02205 Unspecified lagophthalmos left lower eyelid: Secondary | ICD-10-CM | POA: Diagnosis not present

## 2019-02-24 DIAGNOSIS — M545 Low back pain: Secondary | ICD-10-CM | POA: Diagnosis not present

## 2019-02-24 DIAGNOSIS — M530 Cervicocranial syndrome: Secondary | ICD-10-CM | POA: Diagnosis not present

## 2019-02-24 DIAGNOSIS — M62830 Muscle spasm of back: Secondary | ICD-10-CM | POA: Diagnosis not present

## 2019-02-24 DIAGNOSIS — M546 Pain in thoracic spine: Secondary | ICD-10-CM | POA: Diagnosis not present

## 2019-02-24 DIAGNOSIS — M6080 Other myositis, unspecified site: Secondary | ICD-10-CM | POA: Diagnosis not present

## 2019-02-24 DIAGNOSIS — M9903 Segmental and somatic dysfunction of lumbar region: Secondary | ICD-10-CM | POA: Diagnosis not present

## 2019-02-24 DIAGNOSIS — M9902 Segmental and somatic dysfunction of thoracic region: Secondary | ICD-10-CM | POA: Diagnosis not present

## 2019-02-24 DIAGNOSIS — M9901 Segmental and somatic dysfunction of cervical region: Secondary | ICD-10-CM | POA: Diagnosis not present

## 2019-03-24 ENCOUNTER — Ambulatory Visit: Payer: 59 | Admitting: Obstetrics and Gynecology

## 2019-04-15 ENCOUNTER — Ambulatory Visit: Payer: Self-pay | Admitting: Obstetrics and Gynecology

## 2019-04-29 ENCOUNTER — Other Ambulatory Visit: Payer: Self-pay

## 2019-04-29 ENCOUNTER — Ambulatory Visit (INDEPENDENT_AMBULATORY_CARE_PROVIDER_SITE_OTHER): Payer: 59 | Admitting: Family Medicine

## 2019-04-29 ENCOUNTER — Encounter: Payer: Self-pay | Admitting: Family Medicine

## 2019-04-29 DIAGNOSIS — G932 Benign intracranial hypertension: Secondary | ICD-10-CM

## 2019-04-29 NOTE — Patient Instructions (Signed)
Referral placed to Neurology, per request, for continuation of care for Idiopathic Intracranial Hypertension.  Contact our office to schedule your yearly wellness and preventative screenings.  You will receive a survey after today's visit either digitally by e-mail or paper by [employer]. Your experiences and feedback matter to [place].  Please respond so we know how we are doing as we provide care for you.  Call us with any questions/concerns/needs.  It is my goal to be available to you for your health concerns.  Thanks for choosing me to be a partner in your healthcare needs!  Charlaine Dalton, FNP-C Family Nurse Practitioner [facility] Phone: [phone]

## 2019-04-29 NOTE — Progress Notes (Signed)
I have reviewed this encounter including the documentation in this note and/or discussed this patient with the provider, Danielle Rankin FNP. I am certifying that I agree with the content of this note as supervising physician.  Saralyn Pilar, DO [facility] 04/29/2019, 4:23 PM

## 2019-04-29 NOTE — Progress Notes (Signed)
Virtual Visit via Telephone The purpose of this virtual visit is to provide medical care while limiting exposure to the novel coronavirus (COVID19) for both patient and office staff.  Consent was obtained for phone visit:  Yes.   Answered questions that patient had about telehealth interaction:  Yes.   I discussed the limitations, risks, security and privacy concerns of performing an evaluation and management service by telephone. I also discussed with the patient that there may be a patient responsible charge related to this service. The patient expressed understanding and agreed to proceed.  Patient Location: Home Provider Location: [facility])  ---------------------------------------------------------------------- Chief Complaint  Patient presents with  . Headache    referral for neurologist. insurance changed    S: Reviewed CMA documentation. I have called patient and gathered additional HPI as follows:  Has had a change in her insurance and needs a referral to continue seeing her neurology, Dr. Malvin Johns, with [facility] for her Idiopathic intracranial hypertension.  Denies any concerns today.  Patient is currently home Denies any high risk travel to areas of current concern for COVID19. Denies any known or suspected exposure to person with or possibly with COVID19.   Past Medical History:  Diagnosis Date  . Complication of anesthesia    GETS AGGRESSIVE   . Endometriosis   . Idiopathic intracranial hypertension 04/2018   Social History   Tobacco Use  . Smoking status: Never Smoker  . Smokeless tobacco: Never Used  Substance Use Topics  . Alcohol use: Not Currently    Comment: possibly 1 x per year  . Drug use: No    Current Outpatient Medications:  .  acetaZOLAMIDE (DIAMOX) 500 MG capsule, Take by mouth., Disp: , Rfl:  .  topiramate (TOPAMAX) 25 MG tablet, Take by mouth., Disp: , Rfl:  .  aspirin-acetaminophen-caffeine (EXCEDRIN MIGRAINE)  250-250-65 MG tablet, Take 2 tablets by mouth 3 (three) times daily as needed for headache or migraine., Disp: , Rfl:  .  levonorgestrel (MIRENA, 52 MG,) 20 MCG/24HR IUD, 1 Intra Uterine Device (1 each total) once for 1 dose by Intrauterine route., Disp: 1 Intra Uterine Device, Rfl: 0  Depression screen [facility] 2/9 04/29/2019 10/23/2017  Decreased Interest 0 0  Down, Depressed, Hopeless 0 1  PHQ - 2 Score 0 1  Altered sleeping - 3  Tired, decreased energy - 0  Change in appetite - 3  Feeling bad or failure about yourself  - 3  Trouble concentrating - 1  Moving slowly or fidgety/restless - 0  Suicidal thoughts - 0  PHQ-9 Score - 11  Difficult doing work/chores - Somewhat difficult    No flowsheet data found.  -------------------------------------------------------------------------- O: No physical exam performed due to remote telephone encounter.  No results found for this or any previous visit (from the past 2160 hour(s)).  -------------------------------------------------------------------------- A&P:  Problem List Items Addressed This Visit      Nervous and Auditory   Idiopathic intracranial hypertension    Patient reports being stable with her diagnosis.  Has followed with Dr. Alphonzo Lemmings, Neurology, from [facility].  States her insurance company has recently changed and she will need a referral to him from PCP to continue seeing him.  Denies any concerns today.  Plan: 1) Referral placed today 2) Schedule and continue follow up with Dr. Malvin Johns for your Idiopathic Intracranial Hypertension 3) Contact us, at your convenience to schedule your physical and preventative screenings.      Relevant Orders  Ambulatory referral to Neurology      No orders of the defined types were placed in this encounter.   Follow-up: - Return in 6 months for Physical  Patient verbalizes understanding with the above medical recommendations including the limitation of remote  medical advice.  Specific follow-up and call-back criteria were given for patient to follow-up or seek medical care more urgently if needed.   - Time spent in direct consultation with patient on phone: 5 minutes  Harlin Rain, [facility] 04/29/2019, 4:12 PM

## 2019-04-29 NOTE — Assessment & Plan Note (Signed)
Patient reports being stable with her diagnosis.  Has followed with Dr. Alphonzo Lemmings, Neurology, from [facility].  States her insurance company has recently changed and she will need a referral to him from PCP to continue seeing him.  Denies any concerns today.  Plan: 1) Referral placed today 2) Schedule and continue follow up with Dr. Malvin Johns for your Idiopathic Intracranial Hypertension 3) Contact us, at your convenience to schedule your physical and preventative screenings.

## 2019-05-06 ENCOUNTER — Other Ambulatory Visit (HOSPITAL_COMMUNITY)
Admission: RE | Admit: 2019-05-06 | Discharge: 2019-05-06 | Disposition: A | Discharge status: Home / Self Care | Payer: No Typology Code available for payment source | Source: Ambulatory Visit | Attending: Obstetrics and Gynecology | Admitting: Obstetrics and Gynecology

## 2019-05-06 ENCOUNTER — Encounter: Payer: Self-pay | Admitting: Obstetrics and Gynecology

## 2019-05-06 ENCOUNTER — Ambulatory Visit (INDEPENDENT_AMBULATORY_CARE_PROVIDER_SITE_OTHER): Payer: No Typology Code available for payment source | Admitting: Obstetrics and Gynecology

## 2019-05-06 ENCOUNTER — Other Ambulatory Visit: Payer: Self-pay

## 2019-05-06 VITALS — BP 137/81 | HR 65 | Ht 65.0 in | Wt 196.0 lb

## 2019-05-06 DIAGNOSIS — Z01419 Encounter for gynecological examination (general) (routine) without abnormal findings: Secondary | ICD-10-CM

## 2019-05-06 DIAGNOSIS — Z113 Encounter for screening for infections with a predominantly sexual mode of transmission: Secondary | ICD-10-CM

## 2019-05-06 DIAGNOSIS — Z124 Encounter for screening for malignant neoplasm of cervix: Secondary | ICD-10-CM | POA: Diagnosis not present

## 2019-05-06 DIAGNOSIS — Z1239 Encounter for other screening for malignant neoplasm of breast: Secondary | ICD-10-CM

## 2019-05-06 DIAGNOSIS — Z30431 Encounter for routine checking of intrauterine contraceptive device: Secondary | ICD-10-CM

## 2019-05-06 NOTE — Progress Notes (Signed)
Gynecology Annual Exam   PCP: Tarri Fuller, FNP  Chief Complaint:  Chief Complaint  Patient presents with  . Gynecologic Exam    History of Present Illness: Patient is a 29 y.o. G2P2002 presents for annual exam. The patient has no complaints today.   LMP: No LMP recorded. (Menstrual status: IUD). Amenorrhea secondary to Mirena IUD  The patient is sexually active. She currently uses IUD for contraception. She denies dyspareunia.  Hannah Beat is no notable family history of breast or ovarian cancer in her family.  The patient wears seatbelts: yes.   The patient has regular exercise: not asked.    The patient denies current symptoms of depression.    Review of Systems: Review of Systems  Constitutional: Negative for chills and fever.  HENT: Negative for congestion.   Respiratory: Negative for cough and shortness of breath.   Cardiovascular: Negative for chest pain and palpitations.  Gastrointestinal: Negative for abdominal pain, constipation, diarrhea, heartburn, nausea and vomiting.  Genitourinary: Negative for dysuria, frequency and urgency.  Skin: Negative for itching and rash.  Neurological: Negative for dizziness and headaches.  Endo/Heme/Allergies: Negative for polydipsia.  Psychiatric/Behavioral: Negative for depression.    Past Medical History:  Past Medical History:  Diagnosis Date  . Complication of anesthesia    GETS AGGRESSIVE   . Endometriosis   . Idiopathic intracranial hypertension 04/2018    Past Surgical History:  Past Surgical History:  Procedure Laterality Date  . CYST EXCISION    . DIAGNOSTIC LAPAROSCOPY    . LUMBAR PUNCTURE  04/2018  . ORIF ANKLE FRACTURE Left 09/07/2015   Procedure: OPEN REDUCTION INTERNAL FIXATION (ORIF) ANKLE FRACTURE;  Surgeon: Kennedy Bucker, MD;  Location: [facility] ORS;  Service: Orthopedics;  Laterality: Left;  . TONSILLECTOMY    . TONSILLECTOMY AND ADENOIDECTOMY      Gynecologic History:  No LMP recorded. (Menstrual  status: IUD). Contraception:01/16/2017 Mirena IUD Last Pap: Results were:  02/19/2017 no abnormalities  12/06/2015 ASCUS HPV negative Obstetric History: [id number]  Family History:  Family History  Problem Relation Age of Onset  . Healthy Mother   . Healthy Father   . Healthy Brother   . Atrial fibrillation Maternal Grandmother   . Lung cancer Paternal Grandmother   . Lung cancer Paternal Grandfather   . Sudden death Brother   . Breast cancer Other     Social History:  Social History   Socioeconomic History  . Marital status: Single    Spouse name: Not on file  . Number of children: Not on file  . Years of education: Not on file  . Highest education level: Bachelor's degree (e.g., BA, AB, BS)  Occupational History  . Occupation: Designer, jewellery  Tobacco Use  . Smoking status: Never Smoker  . Smokeless tobacco: Never Used  Substance and Sexual Activity  . Alcohol use: Not Currently    Comment: possibly 1 x per year  . Drug use: No  . Sexual activity: Yes    Birth control/protection: I.U.D.    Comment: Mirena  Other Topics Concern  . Not on file  Social History Narrative  . Not on file   Social Determinants of Health   Financial Resource Strain:   . Difficulty of Paying Living Expenses: Not on file  Food Insecurity:   . Worried About Programme researcher, broadcasting/film/video in the Last Year: Not on file  . Ran Out of Food in the Last Year: Not on file  Transportation Needs:   . Lack  of Transportation (Medical): Not on file  . Lack of Transportation (Non-Medical): Not on file  Physical Activity:   . Days of Exercise per Week: Not on file  . Minutes of Exercise per Session: Not on file  Stress:   . Feeling of Stress : Not on file  Social Connections:   . Frequency of Communication with Friends and Family: Not on file  . Frequency of Social Gatherings with Friends and Family: Not on file  . Attends Religious Services: Not on file  . Active Member of Clubs or Organizations: Not on  file  . Attends Archivist Meetings: Not on file  . Marital Status: Not on file  Intimate Partner Violence:   . Fear of Current or Ex-Partner: Not on file  . Emotionally Abused: Not on file  . Physically Abused: Not on file  . Sexually Abused: Not on file    Allergies:  Allergies  Allergen Reactions  . Septra [Sulfamethoxazole-Trimethoprim] Rash    Medications: Prior to Admission medications   Medication Sig Start Date End Date Taking? Authorizing Provider  acetaZOLAMIDE (DIAMOX) 500 MG capsule Take by mouth. 07/13/18   [provider]  aspirin-acetaminophen-caffeine (EXCEDRIN MIGRAINE) [phone] MG tablet Take 2 tablets by mouth 3 (three) times daily as needed for headache or migraine.    [provider]  levonorgestrel (MIRENA, 52 MG,) 20 MCG/24HR IUD 1 Intra Uterine Device (1 each total) once for 1 dose by Intrauterine route. 01/16/17 26/33/35  Copland, Deirdre Evener, PA-C  topiramate (TOPAMAX) 25 MG tablet Take by mouth. 07/13/18   [provider]    Physical Exam Vitals: Blood pressure 137/81, pulse 65, height 5\' 5"  (1.651 m), weight 196 lb (88.9 kg).   General: NAD HEENT: normocephalic, anicteric Thyroid: no enlargement, no palpable nodules Pulmonary: No increased work of breathing, CTAB Cardiovascular: RRR, distal pulses 2+ Breast: Breast symmetrical, no tenderness, no palpable nodules or masses, no skin or nipple retraction present, no nipple discharge.  No axillary or supraclavicular lymphadenopathy. Abdomen: NABS, soft, non-tender, non-distended.  Umbilicus without lesions.  No hepatomegaly, splenomegaly or masses palpable. No evidence of hernia  Genitourinary:  External: Normal external female genitalia.  Normal urethral meatus, normal Bartholin's and Skene's glands.    Vagina: Normal vaginal mucosa, no evidence of prolapse.    Cervix: Grossly normal in appearance, no bleeding  Uterus: Non-enlarged, mobile, normal contour.  No  CMT  Adnexa: ovaries non-enlarged, no adnexal masses  Rectal: deferred  Lymphatic: no evidence of inguinal lymphadenopathy Extremities: no edema, erythema, or tenderness Neurologic: Grossly intact Psychiatric: mood appropriate, affect full  Female chaperone present for pelvic and breast  portions of the physical exam    Assessment: 29 y.o. G2P2002 routine annual exam  Plan: Problem List Items Addressed This Visit    None    Visit Diagnoses    Screening for malignant neoplasm of cervix    -  Primary   Relevant Orders   Cytology - PAP   Encounter for gynecological examination without abnormal finding       Breast screening       IUD check up       Routine screening for STI (sexually transmitted infection)       Relevant Orders   Cytology - PAP      1) STI screening  was offered and accepted  2)  ASCCP guidelines and rational discussed.  Patient opts for every 3 years screening interval  3) Contraception - the patient is currently using  IUD.  She is happy with her current form of contraception and plans to continue - repeat today as 3 year mark coming up in December  4) Routine healthcare maintenance including cholesterol, diabetes screening discussed managed by PCP  5) Return in about 1 year (around 05/05/2020) for annual.   Vena Austria, MD, [facility], [facility] 05/06/2019, 10:38 AM

## 2019-05-12 LAB — CYTOLOGY - PAP
Chlamydia: NEGATIVE
Comment: NEGATIVE
Comment: NEGATIVE
Comment: NORMAL
Diagnosis: UNDETERMINED — AB
High risk HPV: POSITIVE — AB
Neisseria Gonorrhea: NEGATIVE

## 2019-06-09 ENCOUNTER — Ambulatory Visit (INDEPENDENT_AMBULATORY_CARE_PROVIDER_SITE_OTHER): Payer: Self-pay | Admitting: Family Medicine

## 2019-06-09 ENCOUNTER — Encounter: Payer: Self-pay | Admitting: Family Medicine

## 2019-06-09 ENCOUNTER — Other Ambulatory Visit: Payer: Self-pay

## 2019-06-09 DIAGNOSIS — B009 Herpesviral infection, unspecified: Secondary | ICD-10-CM | POA: Insufficient documentation

## 2019-06-09 MED ORDER — VALACYCLOVIR HCL 1 G PO TABS: 1000.0000 mg | ORAL_TABLET | Freq: Three times a day (TID) | ORAL | 2 refills | Status: AC

## 2019-06-09 NOTE — Progress Notes (Signed)
Virtual Visit via Telephone  The purpose of this virtual visit is to provide medical care while limiting exposure to the novel coronavirus (COVID19) for both patient and office staff.  Consent was obtained for phone visit:  Yes.   Answered questions that patient had about telehealth interaction:  Yes.   I discussed the limitations, risks, security and privacy concerns of performing an evaluation and management service by telephone. I also discussed with the patient that there may be a patient responsible charge related to this service. The patient expressed understanding and agreed to proceed.  Patient is at home and is accessed via telephone Services are provided by Harlin Rain, FNP-C from [facility])  ---------------------------------------------------------------------- Chief Complaint  Patient presents with  . Mouth Lesions    tingling feeling yesterday now a cold sore developed.     S: Reviewed CMA documentation. I have called patient and gathered additional HPI as follows:  Kimberly Lloyd presents for telemedicine visit with concerns of cold sore on her lip.  Reports that since 2013 she has had approximately 3 outbreaks, with her last one in January 2021 prior to this episode.  Has taken valacyclovir 1g TID in the past with good relief and requesting refill.  Patient is currently home Denies any high risk travel to areas of current concern for COVID19. Denies any known or suspected exposure to person with or possibly with COVID19.  Denies any fevers, chills, sweats, body ache, cough, shortness of breath, sinus pain or pressure, headache, abdominal pain, diarrhea  Past Medical History:  Diagnosis Date  . Complication of anesthesia    GETS AGGRESSIVE   . Endometriosis   . Idiopathic intracranial hypertension 04/2018   Social History   Tobacco Use  . Smoking status: Never Smoker  . Smokeless tobacco: Never Used  Substance Use Topics  . Alcohol  use: Not Currently    Comment: possibly 1 x per year  . Drug use: No    Current Outpatient Medications:  .  levonorgestrel (MIRENA, 52 MG,) 20 MCG/24HR IUD, 1 Intra Uterine Device (1 each total) once for 1 dose by Intrauterine route., Disp: 1 Intra Uterine Device, Rfl: 0 .  valACYclovir (VALTREX) 1000 MG tablet, Take 1 tablet (1,000 mg total) by mouth 3 (three) times daily for 10 days., Disp: 30 tablet, Rfl: 2  Depression screen [facility] 2/9 04/29/2019 10/23/2017  Decreased Interest 0 0  Down, Depressed, Hopeless 0 1  PHQ - 2 Score 0 1  Altered sleeping - 3  Tired, decreased energy - 0  Change in appetite - 3  Feeling bad or failure about yourself  - 3  Trouble concentrating - 1  Moving slowly or fidgety/restless - 0  Suicidal thoughts - 0  PHQ-9 Score - 11  Difficult doing work/chores - Somewhat difficult    No flowsheet data found.  -------------------------------------------------------------------------- O: No physical exam performed due to remote telephone encounter.  Physical Exam: Patient remotely monitored without video.  Verbal communication appropriate.  Cognition normal.   Recent Results (from the past 2160 hour(s))  Cytology - PAP     Status: Abnormal   Collection Time: 05/06/19 11:00 AM  Result Value Ref Range   High risk HPV Positive (A)    Neisseria Gonorrhea Negative    Chlamydia Negative    Adequacy      Satisfactory for evaluation; transformation zone component PRESENT.   Diagnosis (A)     - Atypical squamous cells of undetermined significance (ASC-US)   Comment  Normal Reference Ranger Chlamydia - Negative    Comment      Normal Reference Range Neisseria Gonorrhea - Negative   Comment Normal Reference Range HPV - Negative     -------------------------------------------------------------------------- A&P:  Problem List Items Addressed This Visit      Other   Herpes simplex - Primary    Current outbreak x 1, last episode January 2021, with a total of  5 episodes since 2013.  Will refill valacylcovir 1g TID x 10 days and to follow up in the office if no change in symptoms or if has more than 6 episodes per year so we can discuss suppressive therapy.      Relevant Medications   valACYclovir (VALTREX) 1000 MG tablet      Meds ordered this encounter  Medications  . valACYclovir (VALTREX) 1000 MG tablet    Sig: Take 1 tablet (1,000 mg total) by mouth 3 (three) times daily for 10 days.    Dispense:  30 tablet    Refill:  2    Follow-up: - Return as needed for this concern  Patient verbalizes understanding with the above medical recommendations including the limitation of remote medical advice.  Specific follow-up and call-back criteria were given for patient to follow-up or seek medical care more urgently if needed.   - Time spent in direct consultation with patient on phone: 5 minutes   Charlaine Dalton, FNP-C [facility] 06/09/2019, 8:57 AM

## 2019-06-09 NOTE — Assessment & Plan Note (Signed)
Current outbreak x 1, last episode January 2021, with a total of 5 episodes since 2013.  Will refill valacylcovir 1g TID x 10 days and to follow up in the office if no change in symptoms or if has more than 6 episodes per year so we can discuss suppressive therapy.

## 2019-06-09 NOTE — Patient Instructions (Signed)
As we discussed, have refilled your valacyclovir and sent to your pharmacy.  Take as directed.  To follow up with [place] as needed.  Discussed if having greater than 6 episodes per year to be seen in clinic so we can discuss suppressive therapy.  You will receive a survey after today's visit either digitally by e-mail or paper by [employer]. Your experiences and feedback matter to [place].  Please respond so we know how we are doing as we provide care for you.  Call us with any questions/concerns/needs.  It is my goal to be available to you for your health concerns.  Thanks for choosing me to be a partner in your healthcare needs!  Charlaine Dalton, FNP-C Family Nurse Practitioner [facility] Phone: [phone]

## 2019-06-15 ENCOUNTER — Ambulatory Visit (INDEPENDENT_AMBULATORY_CARE_PROVIDER_SITE_OTHER): Payer: No Typology Code available for payment source | Admitting: Obstetrics and Gynecology

## 2019-06-15 ENCOUNTER — Other Ambulatory Visit: Payer: Self-pay | Admitting: Neurology

## 2019-06-15 ENCOUNTER — Other Ambulatory Visit (HOSPITAL_COMMUNITY)
Admission: RE | Admit: 2019-06-15 | Discharge: 2019-06-15 | Disposition: A | Discharge status: Home / Self Care | Payer: No Typology Code available for payment source | Source: Ambulatory Visit | Attending: Obstetrics and Gynecology | Admitting: Obstetrics and Gynecology

## 2019-06-15 ENCOUNTER — Encounter: Payer: Self-pay | Admitting: Obstetrics and Gynecology

## 2019-06-15 ENCOUNTER — Other Ambulatory Visit: Payer: Self-pay

## 2019-06-15 VITALS — BP 128/80 | HR 64 | Wt 206.0 lb

## 2019-06-15 DIAGNOSIS — R87810 Cervical high risk human papillomavirus (HPV) DNA test positive: Secondary | ICD-10-CM

## 2019-06-15 DIAGNOSIS — G932 Benign intracranial hypertension: Secondary | ICD-10-CM

## 2019-06-15 DIAGNOSIS — R87610 Atypical squamous cells of undetermined significance on cytologic smear of cervix (ASC-US): Secondary | ICD-10-CM | POA: Diagnosis not present

## 2019-06-15 NOTE — Progress Notes (Signed)
   GYNECOLOGY CLINIC COLPOSCOPY PROCEDURE NOTE  29 y.o. [id number] here for colposcopy for ASCUS with POSITIVE high risk HPV  pap smear on 05/06/2019. Discussed underlying role for HPV infection in the development of cervical dysplasia, its natural history and progression/regression, need for surveillance.  Is the patient  pregnant: No LMP: No LMP recorded. (Menstrual status: IUD). Smoking status:  reports that she has never smoked. She has never used smokeless tobacco. Contraception: IUD Number current sexual partners:  1  Patient given informed consent, signed copy in the chart, time out was performed.  The patient was position in dorsal lithotomy position. Speculum was placed the cervix was visualized.   After application of acetic acid colposcopic inspection of the cervix was undertaken.   Colposcopy adequate, full visualization of transformation zone: Yes no visible lesions; corresponding biopsies obtained.   ECC specimen obtained:  Yes  All specimens were labeled and sent to pathology.   Patient was given post procedure instructions.  Will follow up pathology and manage accordingly.  Routine preventative health maintenance measures emphasized.  OBGyn Exam  Vena Austria, MD, [facility], [facility]

## 2019-06-17 LAB — SURGICAL PATHOLOGY

## 2019-06-24 ENCOUNTER — Other Ambulatory Visit: Payer: Self-pay

## 2019-06-24 ENCOUNTER — Ambulatory Visit
Admission: RE | Admit: 2019-06-24 | Discharge: 2019-06-24 | Disposition: A | Discharge status: Home / Self Care | Payer: No Typology Code available for payment source | Source: Ambulatory Visit | Attending: Neurology | Admitting: Neurology

## 2019-06-24 DIAGNOSIS — G932 Benign intracranial hypertension: Secondary | ICD-10-CM | POA: Insufficient documentation

## 2019-06-24 LAB — CBC
HCT: 37.7 % (ref 36.0–46.0)
Hemoglobin: 12.6 g/dL (ref 12.0–15.0)
MCH: 29.7 pg (ref 26.0–34.0)
MCHC: 33.4 g/dL (ref 30.0–36.0)
MCV: 88.9 fL (ref 80.0–100.0)
Platelets: 345 10*3/uL (ref 150–400)
RBC: 4.24 MIL/uL (ref 3.87–5.11)
RDW: 12.2 % (ref 11.5–15.5)
WBC: 8.2 10*3/uL (ref 4.0–10.5)
nRBC: 0 % (ref 0.0–0.2)

## 2019-06-24 LAB — APTT: aPTT: 33 seconds (ref 24–36)

## 2019-06-24 LAB — PROTIME-INR
INR: 0.9 (ref 0.8–1.2)
Prothrombin Time: 11.5 seconds (ref 11.4–15.2)

## 2019-06-24 LAB — POCT PREGNANCY, URINE: Preg Test, Ur: NEGATIVE

## 2019-06-24 MED ORDER — ACETAMINOPHEN 325 MG PO TABS: 650.0000 mg | ORAL_TABLET | ORAL | Status: DC | PRN

## 2019-09-30 ENCOUNTER — Encounter: Payer: Self-pay | Admitting: Family Medicine

## 2019-09-30 ENCOUNTER — Ambulatory Visit (INDEPENDENT_AMBULATORY_CARE_PROVIDER_SITE_OTHER): Payer: No Typology Code available for payment source | Admitting: Family Medicine

## 2019-09-30 ENCOUNTER — Other Ambulatory Visit: Payer: Self-pay

## 2019-09-30 VITALS — BP 122/63 | HR 65 | Temp 98.1°F | Ht 65.0 in | Wt 209.2 lb

## 2019-09-30 DIAGNOSIS — F419 Anxiety disorder, unspecified: Secondary | ICD-10-CM | POA: Diagnosis not present

## 2019-09-30 MED ORDER — DIAZEPAM 5 MG PO TABS: 5.0000 mg | ORAL_TABLET | Freq: Four times a day (QID) | ORAL | 0 refills | Status: DC | PRN

## 2019-09-30 NOTE — Progress Notes (Signed)
Subjective:    Patient ID: [name], female    DOB: October 18, 1990, 29 y.o.   MRN: [id number]  Kimberly Lloyd is a 29 y.o. female presenting on 09/30/2019 for Anxiety (pt feeling real stress out x 1 week )   HPI  Kimberly Lloyd presents to clinic for anxiety regarding COVID vaccination.  Is having a difficult time managing her anxiety regarding recent mandate for employment.  Denies history of anxiety/depression in past.  Depression screen [facility] 2/9 09/30/2019 04/29/2019 10/23/2017  Decreased Interest 1 0 0  Down, Depressed, Hopeless 2 0 1  PHQ - 2 Score 3 0 1  Altered sleeping 0 - 3  Tired, decreased energy 1 - 0  Change in appetite 1 - 3  Feeling bad or failure about yourself  2 - 3  Trouble concentrating 0 - 1  Moving slowly or fidgety/restless 2 - 0  Suicidal thoughts 0 - 0  PHQ-9 Score 9 - 11  Difficult doing work/chores Somewhat difficult - Somewhat difficult    Social History   Tobacco Use  . Smoking status: Never Smoker  . Smokeless tobacco: Never Used  Vaping Use  . Vaping Use: Never used  Substance Use Topics  . Alcohol use: Not Currently    Comment: possibly 1 x per year  . Drug use: No    Review of Systems  Constitutional: Negative.   HENT: Negative.   Eyes: Negative.   Respiratory: Negative.   Cardiovascular: Negative.   Gastrointestinal: Negative.   Endocrine: Negative.   Genitourinary: Negative.   Musculoskeletal: Negative.   Skin: Negative.   Allergic/Immunologic: Negative.   Neurological: Negative.   Hematological: Negative.   Psychiatric/Behavioral: Positive for agitation. Negative for behavioral problems, confusion, decreased concentration, dysphoric mood, hallucinations, self-injury, sleep disturbance and suicidal ideas. The patient is nervous/anxious. The patient is not hyperactive.    Per HPI unless specifically indicated above     Objective:    BP (!) 122/63 (BP Location: Right Arm, Patient Position: Sitting, Cuff Size: Normal)   Pulse  65   Temp 98.1 F (36.7 C) (Oral)   Ht 5\' 5"  (1.651 m)   Wt (!) 209 lb 3.2 oz (94.9 kg)   SpO2 100%   BMI 34.81 kg/m   Wt Readings from Last 3 Encounters:  09/30/19 (!) 209 lb 3.2 oz (94.9 kg)  06/15/19 206 lb (93.4 kg)  05/06/19 196 lb (88.9 kg)    Physical Exam Vitals reviewed.  Constitutional:      General: She is not in acute distress.    Appearance: Normal appearance. She is well-developed and well-groomed. She is obese. She is not ill-appearing or toxic-appearing.  HENT:     Head: Normocephalic and atraumatic.     Nose:     Comments: 07/06/19 is in place, covering mouth and nose. Eyes:     General: Lids are normal. Vision grossly intact.        Right eye: No discharge.        Left eye: No discharge.     Extraocular Movements: Extraocular movements intact.     Conjunctiva/sclera: Conjunctivae normal.     Pupils: Pupils are equal, round, and reactive to light.  Cardiovascular:     Rate and Rhythm: Normal rate and regular rhythm.     Pulses: Normal pulses.          Dorsalis pedis pulses are 2+ on the right side and 2+ on the left side.     Heart sounds: Normal heart  sounds. No murmur heard.  No friction rub. No gallop.   Pulmonary:     Effort: Pulmonary effort is normal. No respiratory distress.     Breath sounds: Normal breath sounds.  Musculoskeletal:     Right lower leg: No edema.     Left lower leg: No edema.  Skin:    General: Skin is warm and dry.     Capillary Refill: Capillary refill takes less than 2 seconds.  Neurological:     General: No focal deficit present.     Mental Status: She is alert and oriented to person, place, and time.  Psychiatric:        Attention and Perception: Attention and perception normal.        Mood and Affect: Mood is anxious. Affect is angry.        Speech: Speech normal.        Behavior: Behavior is agitated. Behavior is cooperative.        Thought Content: Thought content normal.        Cognition and Memory: Cognition and  memory normal.        Judgment: Judgment normal.    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 CERVIX, 12 O'CLOCK, BIOPSY: - Mild squamous dysplasia (CIN-I).  B. ENDOCERVIX, CURETTAGE: - Benign endocervical type mucosa. - There is no evidence of malignancy.   GROSS DESCRIPTION:  A.  Received in formalin is a tan, soft tissue fragment that is submitted in toto.  Size: 0.5 cm, 1 block submitted.  B.  Received in formalin is blood tinged mucus that is entirely submitted in one block.  Volume: 2 x 2 x 0.1 cm (1 B) (AK 06/16/2019)   Final Diagnosis performed by Pecola Leisure, MD.   Electronically signed 06/17/2019 Technical and / or Professional components performed at [facility]. [facility], [address] [place], [place] [address].  Immunohistochemistry Technical component (if applicable) was performed at [facility]. [address], [place], [place] [address].   IMMUNOHISTOCHEMISTRY DISCLAIMER (if applicable): Some of these immunohistochemical stains may have been developed and the performance characteristics determine by [facility]. Some may not have been cleared or approved by the U.S. Food and Drug Administration. The FDA has determined that such clearance or approval is not necessary. This test is used for clinical purposes. It should not be regarded as investigational or for research. This laboratory is certified under the Clinical Laboratory Improvement Amendments of 1988 (CLIA-88) as qualified to perform high complexity clinical laboratory testing.  The controls stained appropriately.       Assessment & Plan:   Problem List  Items Addressed This Visit      Other   Anxiety - Primary    PHQ9-9/GAD7-17.  New onset of anxiety x 1 week r/t mandate of COVID vaccination.  Concerns of loss of income/job if not in compliance.  Discussed concerns and reviewed exemptions to vaccine.  Patient in agreement to proceed with vaccine, requesting assistance with not being as anxious when she gets vaccinated.  Discussed options such as diazepam to help with anxiety during event.  Encouraged to take 1 dose when she is off from  work to see how the medication will effect her.  To take 1 dose an hour before her vaccination and have a driver, as this medication can be sedating.  Patient in agreement and verbalized understanding.  Mood handout provided.  Plan: 1. Begin diazepam 5mg , 1 dose on a day off from work for evaluation of effectiveness. 2. Take diazepam 5mg , 1 hour before COVID vaccination and have a driver to/from that appointment 3. RTC in 3 months for anxiety f/u 4. Review mood handout provided      Relevant Medications   diazepam (VALIUM) 5 MG tablet      Meds ordered this encounter  Medications  . diazepam (VALIUM) 5 MG tablet    Sig: Take 1 tablet (5 mg total) by mouth every 6 (six) hours as needed for anxiety.    Dispense:  4 tablet    Refill:  0      Follow up plan: Return in about 3 months (around 12/31/2019) for Anxiety follow up.   , FNP Family Nurse Practitioner [facility] 09/30/2019, 11:05 AM

## 2019-09-30 NOTE — Patient Instructions (Signed)
As we discussed, I have sent in a prescription for Valium 5mg  to take 1 hour prior to your COVID vaccination.  It would be preferable to take one dose on a day that you are at home to know how the medication will effect you.  Plan to have a driver when you take the prescription before you have your COVID vaccine.  The following recommendations are helpful adjuncts for helping rebalance your mood.  Eat a nourishing diet. Ensure adequate intake of calories, protein, carbs, fat, vitamins, and minerals. Prioritize whole foods at each meal, including meats, vegetables, fruits, nuts and seeds, etc.   Avoid inflammatory and/or "junk" foods, such as sugar, omega-6 fats, refined grains, chemicals, and preservatives are common in packaged and prepared foods. Minimize or completely avoid these ingredients and stick to whole foods with little to no additives. Cook from scratch as much as possible for more control over what you eat  Get enough sleep. Poor sleep is significantly associated with depression and anxiety. Make 7-9 hours of sleep nightly a top priority  Exercise appropriately. Exercise is known to improve brain functioning and boost mood. Aim for 30 minutes of daily physical activity. Avoid "overtraining," which can cause mental disturbances  Assess your light exposure. Not enough natural light during the day and too much artificial light can have a major impact on your mood. Get outside as often as possible during daylight hours. Minimize light exposure after dark and avoid the use of electronics that give off blue light before bed  Manage your stress.  Use daily stress management techniques such as meditation, yoga, or mindfulness to retrain your brain to respond differently to stress. Try deep breathing to deactivate your "fight or flight" response.  There are many of sources with apps like Headspace, Calm or a variety of YouTube videos (videos from have guided  meditation)  Prioritize your social life. Work on building social support with new friends or improve current relationships. Consider getting a pet that allows for companionship, social interaction, and physical touch. Try volunteering or joining a faith-based community to increase your sense of purpose  4-7-8 breathing technique at bedtime: breathe in to count of 4, hold breath for count of 7, exhale for count of 8; do 3-5 times for letting go of overactive thoughts  Take time to play Unstructured "play" time can help reduce anxiety and depression Options for play include music, games, sports, dance, art, etc.  Try to add daily omega 3 fatty acids, magnesium, B complex, and balanced amino acid supplements to help improve mood and anxiety.  We will plan to see you back in 3 months for anxiety follow up visit  You will receive a survey after today's visit either digitally by e-mail or paper by USPS mail. Your experiences and feedback matter to Rom[facility] Belling.  Please respond so we know how we are doing as we provide care for you.  Call us with any questions/concerns/needs.  It is my goal to be available to you for your health concerns.  Thanks for choosing me to be a partner in your healthcare needs!  [place], FNP-C Family Nurse Practitioner [facility] Phone: [phone]

## 2019-09-30 NOTE — Assessment & Plan Note (Signed)
PHQ9-9/GAD7-17.  New onset of anxiety x 1 week r/t mandate of COVID vaccination.  Concerns of loss of income/job if not in compliance.  Discussed concerns and reviewed exemptions to vaccine.  Patient in agreement to proceed with vaccine, requesting assistance with not being as anxious when she gets vaccinated.  Discussed options such as diazepam to help with anxiety during event.  Encouraged to take 1 dose when she is off from work to see how the medication will effect her.  To take 1 dose an hour before her vaccination and have a driver, as this medication can be sedating.  Patient in agreement and verbalized understanding.  Mood handout provided.  Plan: 1. Begin diazepam 5mg , 1 dose on a day off from work for evaluation of effectiveness. 2. Take diazepam 5mg , 1 hour before COVID vaccination and have a driver to/from that appointment 3. RTC in 3 months for anxiety f/u 4. Review mood handout provided

## 2019-10-07 IMAGING — MR MR MRV HEAD W/O CM
2 series · 26 of 48 positions shown · non-contrast
Comparison: 04/04/2018 MRI head.  04/02/2018 CT head.

CLINICAL DATA: 27 y/o  F; 2 months of headache and double vision.

EXAM:
MRA HEAD WITHOUT CONTRAST
MRV HEAD WITHOUT CONTRAST
TECHNIQUE: Angiographic images of the Circle of Willis were obtained using MRA
technique without intravenous contrast. Venographic images of the
head were obtained using MRV technique without intravenous contrast.

[Series 2: TOF · axial · non-contrast · 0.5mm · 0.35mm/px · z∈[-32,+57]mm · 16 of 186 slices shown]
[im 1/186]
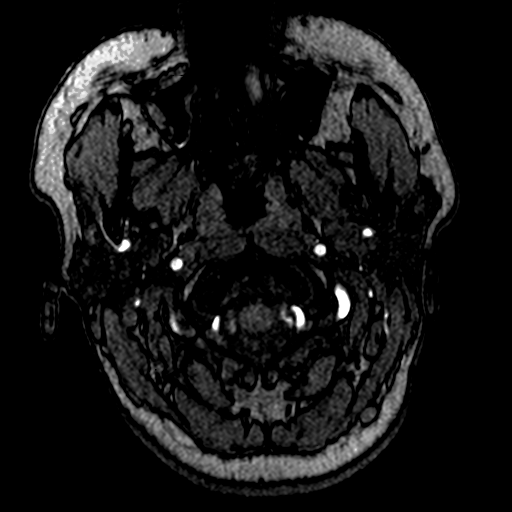
[im 6/186]
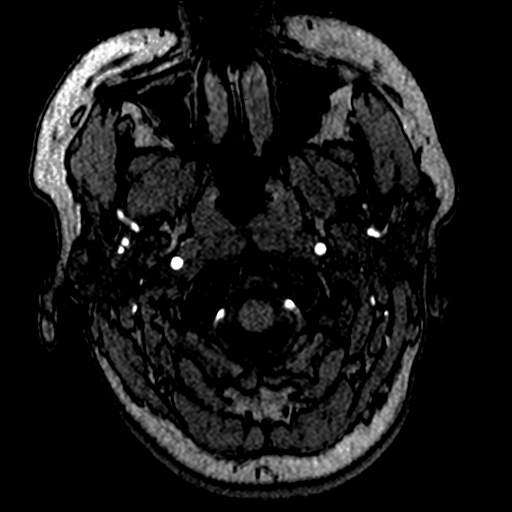
[im 12/186]
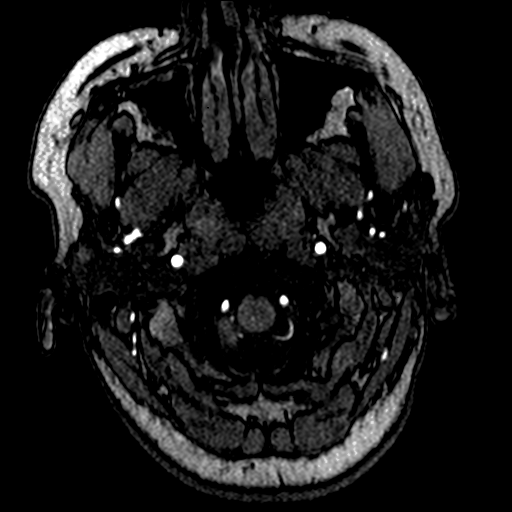
[im 18/186]
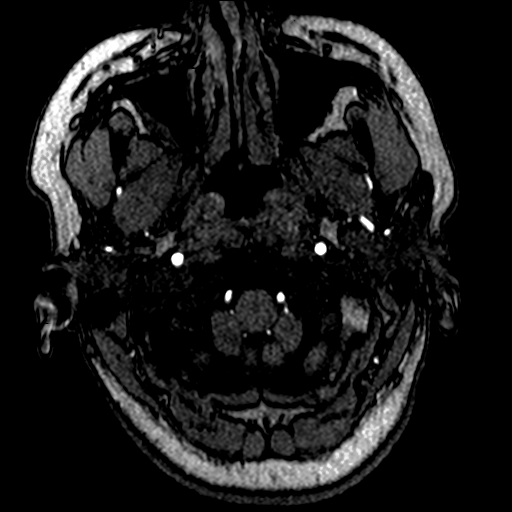
[im 24/186]
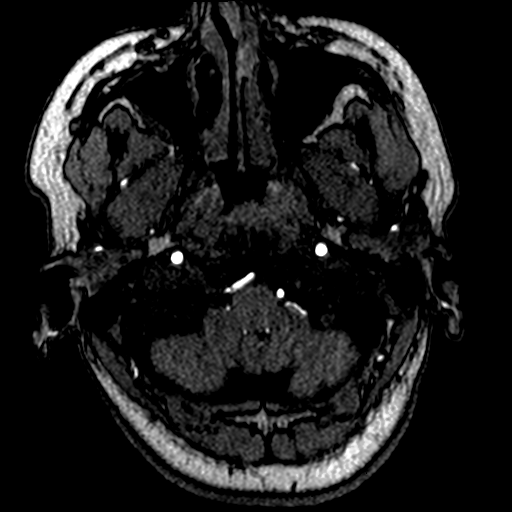
[im 30/186]
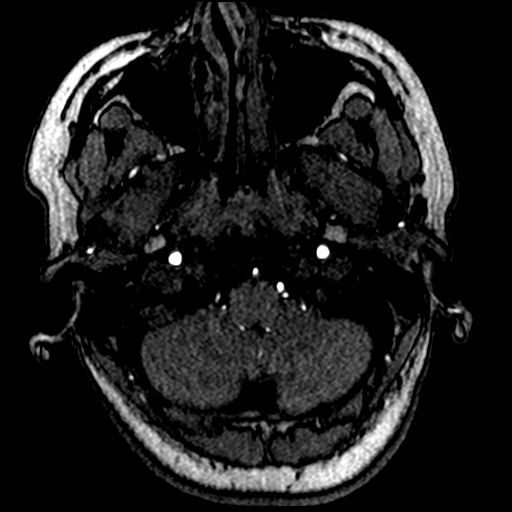
[im 36/186]
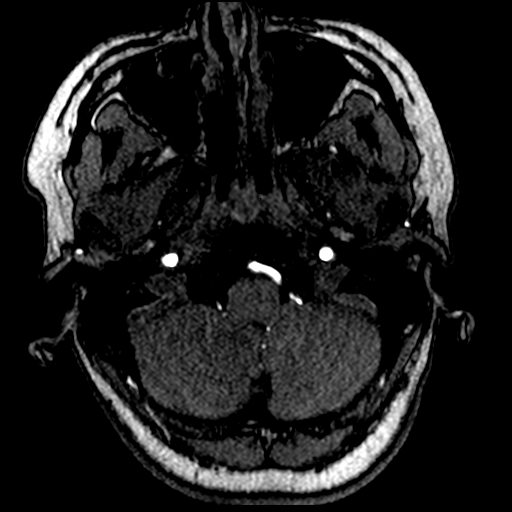
[im 42/186]
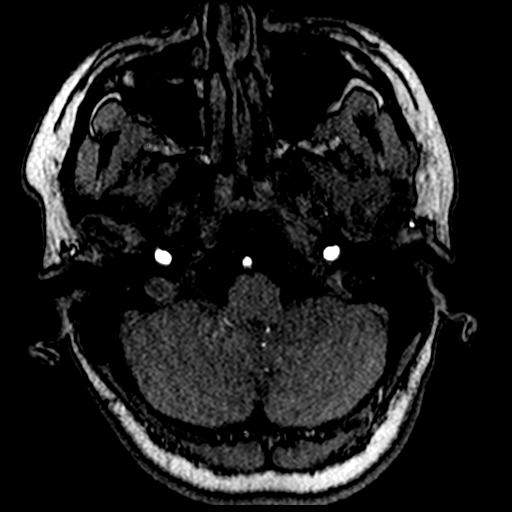
[im 48/186]
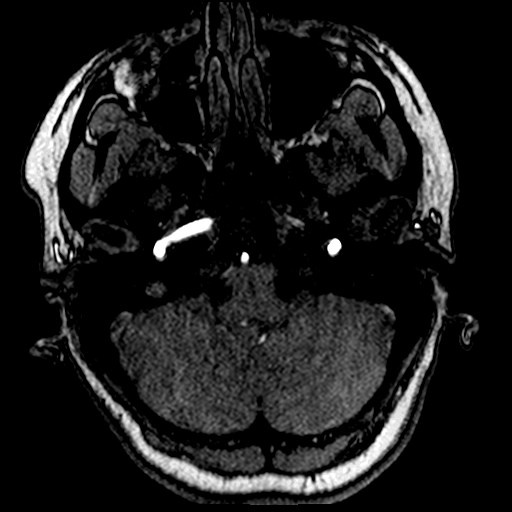
[im 60/186]
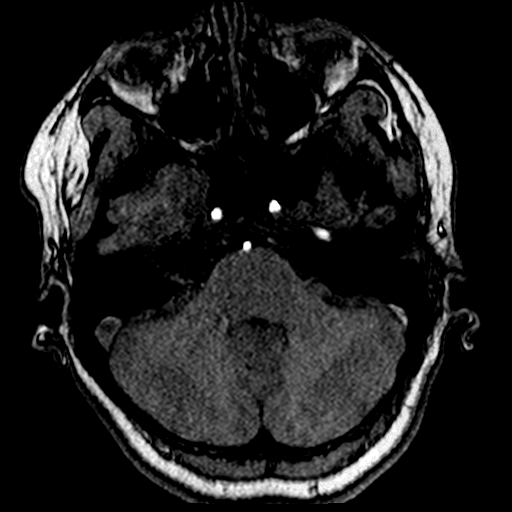
[im 84/186]
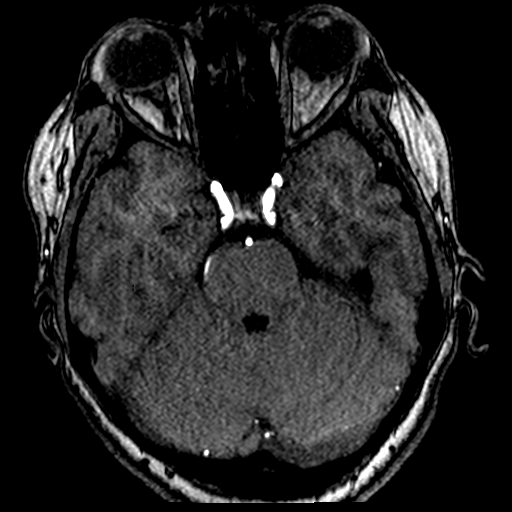
[im 96/186]
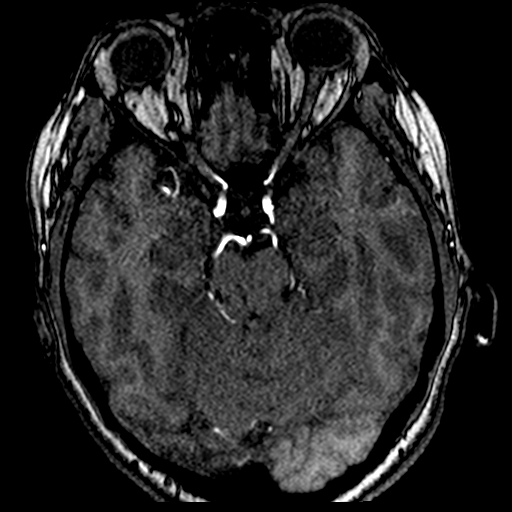
[im 108/186]
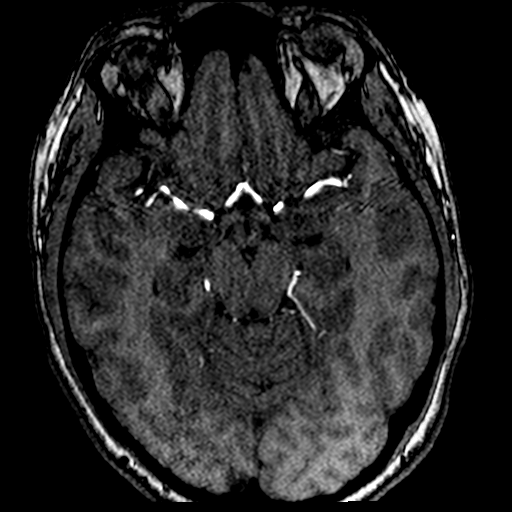
[im 132/186]
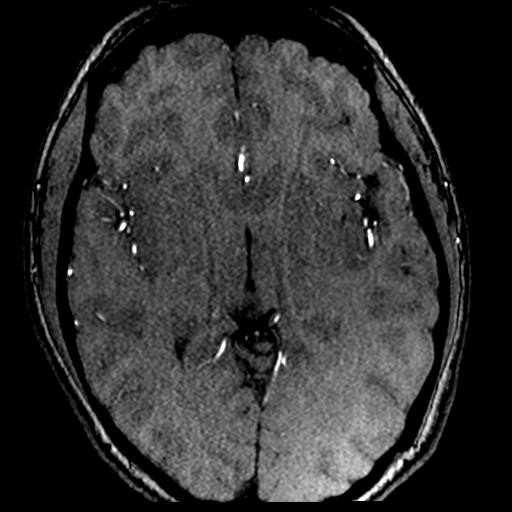
[im 156/186]
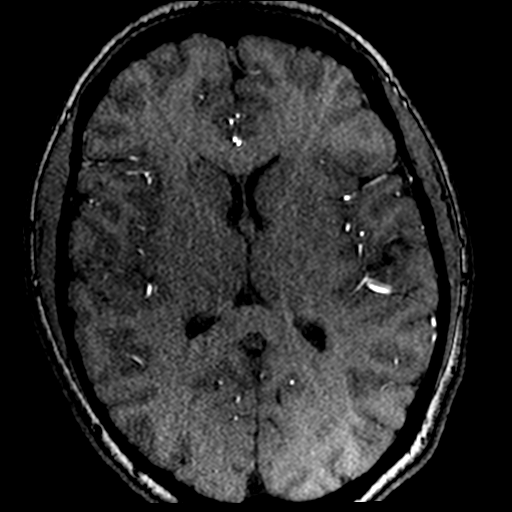
[im 180/186]
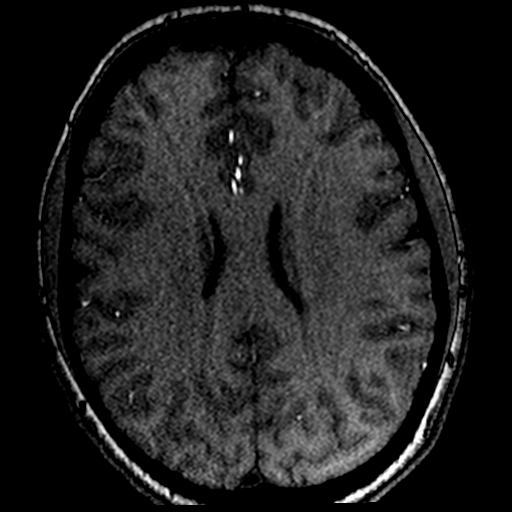

[Series 7: tof_2d_veins · coronal · 3.0mm · 0.98mm/px · 10 of 93 slices shown]
[im 7/93]
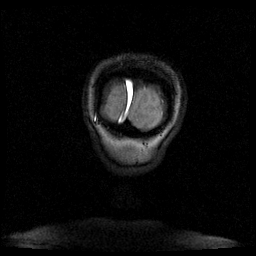
[im 13/93]
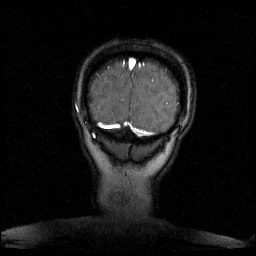
[im 19/93]
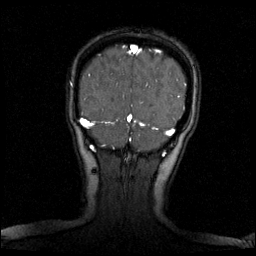
[im 31/93]
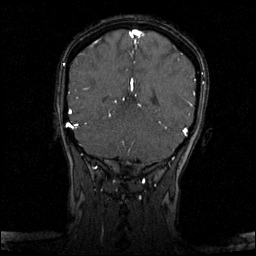
[im 43/93]
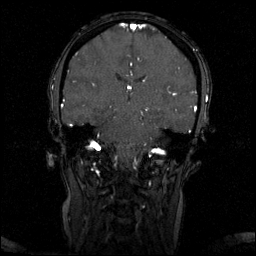
[im 50/93]
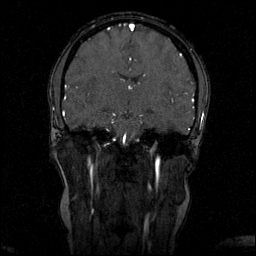
[im 56/93]
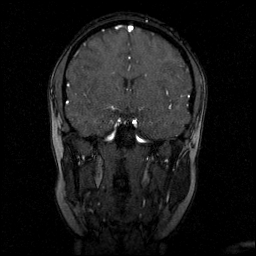
[im 68/93]
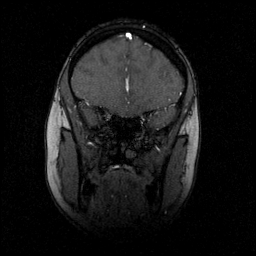
[im 80/93]
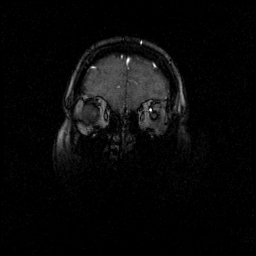
[im 93/93]
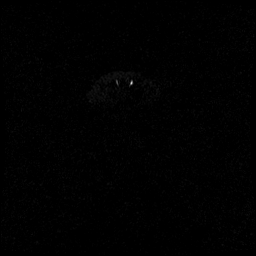

[26 of 48 positions shown; findings below may reference images not displayed]

FINDINGS: MRA findings

Internal carotid arteries:  Patent.

Anterior cerebral arteries:  Patent.  Azygos A2.

Middle cerebral arteries: Patent.

Anterior communicating artery: Patent.

Posterior communicating arteries:  Patent.

Posterior cerebral arteries:  Patent.

Basilar artery:  Patent.

Vertebral arteries:  Patent.

No evidence of high-grade stenosis, large vessel occlusion, or
aneurysm unless noted above.

MRV findings

Patent superior sagittal sinus, straight sinus, internal cerebral
veins, bilateral transverse sinus, bilateral sigmoid sinus,
bilateral upper internal jugular veins, and large cortical veins. No
evidence of dural venous sinus thrombosis.
IMPRESSION: 1. Normal MRA of the head.
2. Normal MRV of the head.

## 2019-12-08 ENCOUNTER — Encounter: Payer: No Typology Code available for payment source | Admitting: Family Medicine

## 2019-12-14 ENCOUNTER — Encounter: Payer: No Typology Code available for payment source | Admitting: Family Medicine

## 2019-12-22 ENCOUNTER — Ambulatory Visit (INDEPENDENT_AMBULATORY_CARE_PROVIDER_SITE_OTHER): Payer: No Typology Code available for payment source | Admitting: Family Medicine

## 2019-12-22 ENCOUNTER — Encounter: Payer: Self-pay | Admitting: Family Medicine

## 2019-12-22 ENCOUNTER — Other Ambulatory Visit: Payer: Self-pay

## 2019-12-22 VITALS — BP 125/64 | HR 62 | Temp 98.5°F | Resp 18 | Ht 65.0 in | Wt 211.0 lb

## 2019-12-22 DIAGNOSIS — R300 Dysuria: Secondary | ICD-10-CM | POA: Diagnosis not present

## 2019-12-22 LAB — POCT URINALYSIS DIPSTICK
Bilirubin, UA: NEGATIVE
Glucose, UA: NEGATIVE
Ketones, UA: NEGATIVE
Nitrite, UA: NEGATIVE
Protein, UA: NEGATIVE
Spec Grav, UA: 1.025 (ref 1.010–1.025)
Urobilinogen, UA: 0.2 E.U./dL
pH, UA: 5 (ref 5.0–8.0)

## 2019-12-22 MED ORDER — PHENAZOPYRIDINE HCL 100 MG PO TABS: 100.0000 mg | ORAL_TABLET | Freq: Three times a day (TID) | ORAL | 0 refills | Status: AC | PRN

## 2019-12-22 MED ORDER — NITROFURANTOIN MONOHYD MACRO 100 MG PO CAPS: 100.0000 mg | ORAL_CAPSULE | Freq: Two times a day (BID) | ORAL | 0 refills | Status: AC

## 2019-12-22 NOTE — Patient Instructions (Signed)
We have sent your urine to the lab for culture and will contact you once we receive the results.  I have sent in a prescription for Macrobid 100mg , to take 1 tablet 2x per day for the next 5 days  Remember to always wipe front to back and empty your bladder pre- and post-intercourse to prevent UTIs.    Drink plenty of fluids.  I have sent in a prescription for Pyridium 100mg , to take 1 tablet up to 3x per day as needed for dysuria.  As we discussed. this will change the color of your urine to an orange/red, this is normal.    You may also experience some urinary dribbling, be sure to wear a pantyliner to protect your clothing from staining.  We will plan to see you back if your symptoms worsen or fail to improve  You will receive a survey after today's visit either digitally by e-mail or paper by USPS mail. Your experiences and feedback matter to .  Please respond so we know how we are doing as we provide care for you.  Call with any questions/concerns/needs.  It is my goal to be available to you for your health concerns.  Thanks for choosing me to be a partner in your healthcare needs!  [place], FNP-C Family Nurse Practitioner [facility] Phone: [phone]

## 2019-12-22 NOTE — Progress Notes (Signed)
Subjective:    Patient ID: Kimberly Lloyd, female    DOB: 1990/10/01, 29 y.o.   MRN: [id number]  Kimberly Lloyd is a 29 y.o. female presenting on 12/22/2019 for Dysuria (urinary urgency, urinary frequency and dysuria x 1 week )   HPI  Kimberly Lloyd presents to clinic with concerns of dysuria, urinary frequency, and urgency x 1 week.  Had taken azo with some symptom improvement.  Denies fevers, hematuria, vaginal discharge, abdominal pain, n/v/d.  Depression screen [facility] 2/9 09/30/2019 04/29/2019 10/23/2017  Decreased Interest 1 0 0  Down, Depressed, Hopeless 2 0 1  PHQ - 2 Score 3 0 1  Altered sleeping 0 - 3  Tired, decreased energy 1 - 0  Change in appetite 1 - 3  Feeling bad or failure about yourself  2 - 3  Trouble concentrating 0 - 1  Moving slowly or fidgety/restless 2 - 0  Suicidal thoughts 0 - 0  PHQ-9 Score 9 - 11  Difficult doing work/chores Somewhat difficult - Somewhat difficult    Social History   Tobacco Use  . Smoking status: Never Smoker  . Smokeless tobacco: Never Used  Vaping Use  . Vaping Use: Never used  Substance Use Topics  . Alcohol use: Not Currently    Comment: possibly 1 x per year  . Drug use: No    Review of Systems  Genitourinary: Positive for dysuria, frequency and urgency. Negative for decreased urine volume, difficulty urinating, dyspareunia, enuresis, flank pain, genital sores, hematuria, menstrual problem, pelvic pain, vaginal bleeding, vaginal discharge and vaginal pain.   Per HPI unless specifically indicated above     Objective:    BP 125/64 (BP Location: Right Arm, Patient Position: Sitting, Cuff Size: Large)   Pulse 62   Temp 98.5 F (36.9 C) (Oral)   Resp 18   Ht 5\' 5"  (1.651 m)   Wt 211 lb (95.7 kg)   LMP 12/19/2019   SpO2 100%   BMI 35.11 kg/m   Wt Readings from Last 3 Encounters:  12/22/19 211 lb (95.7 kg)  09/30/19 (!) 209 lb 3.2 oz (94.9 kg)  06/15/19 206 lb (93.4 kg)    Physical Exam Vitals and nursing note  reviewed.  Constitutional:      General: She is not in acute distress.    Appearance: Normal appearance. She is well-developed and well-groomed. She is obese. She is not ill-appearing or toxic-appearing.  HENT:     Head: Normocephalic and atraumatic.     Nose:     Comments: 06/17/19 is in place, covering mouth and nose. Eyes:     General: Lids are normal. Vision grossly intact.        Right eye: No discharge.        Left eye: No discharge.     Extraocular Movements: Extraocular movements intact.     Conjunctiva/sclera: Conjunctivae normal.     Pupils: Pupils are equal, round, and reactive to light.  Cardiovascular:     Rate and Rhythm: Normal rate and regular rhythm.     Pulses: Normal pulses.     Heart sounds: Normal heart sounds. No murmur heard.  No friction rub. No gallop.   Pulmonary:     Effort: Pulmonary effort is normal. No respiratory distress.     Breath sounds: Normal breath sounds.  Abdominal:     Tenderness: There is no right CVA tenderness or left CVA tenderness.  Skin:    General: Skin is warm and dry.  Capillary Refill: Capillary refill takes less than 2 seconds.  Neurological:     General: No focal deficit present.     Mental Status: She is alert and oriented to person, place, and time.  Psychiatric:        Attention and Perception: Attention and perception normal.        Mood and Affect: Mood and affect normal.        Speech: Speech normal.        Behavior: Behavior normal. Behavior is cooperative.        Thought Content: Thought content normal.        Cognition and Memory: Cognition and memory normal.        Judgment: Judgment normal.    Results for orders placed or performed in visit on 12/22/19  POCT Urinalysis Dipstick  Result Value Ref Range   Color, UA yellow    Clarity, UA clear    Glucose, UA Negative Negative   Bilirubin, UA negative    Ketones, UA negative    Spec Grav, UA 1.025 1.010 - 1.025   Blood, UA large    pH, UA 5.0 5.0 - 8.0     Protein, UA Negative Negative   Urobilinogen, UA 0.2 0.2 or 1.0 E.U./dL   Nitrite, UA negative    Leukocytes, UA Large (3+) (A) Negative   Appearance     Odor        Assessment & Plan:   Problem List Items Addressed This Visit      Other   Dysuria - Primary    Likely UTI based on POCT U/A.  Will send for culture and treat with Macrobid 100mg  PO BID x 5 days.  Will send in prescription for pyridium to take 100mg  TID PRN for urinary discomfort.  Will contact if we need to change antibiotics based on C&S results.      Relevant Medications   nitrofurantoin, macrocrystal-monohydrate, (MACROBID) 100 MG capsule   phenazopyridine (PYRIDIUM) 100 MG tablet   Other Relevant Orders   POCT Urinalysis Dipstick (Completed)   Urine Culture      Meds ordered this encounter  Medications  . nitrofurantoin, macrocrystal-monohydrate, (MACROBID) 100 MG capsule    Sig: Take 1 capsule (100 mg total) by mouth 2 (two) times daily for 5 days.    Dispense:  10 capsule    Refill:  0  . phenazopyridine (PYRIDIUM) 100 MG tablet    Sig: Take 1 tablet (100 mg total) by mouth 3 (three) times daily as needed for up to 2 days for pain.    Dispense:  6 tablet    Refill:  0    Follow up plan: Return if symptoms worsen or fail to improve.   , FNP Family Nurse Practitioner [facility] 12/22/2019, 11:56 AM

## 2019-12-22 NOTE — Assessment & Plan Note (Signed)
Likely UTI based on POCT U/A.  Will send for culture and treat with Macrobid 100mg  PO BID x 5 days.  Will send in prescription for pyridium to take 100mg  TID PRN for urinary discomfort.  Will contact if we need to change antibiotics based on C&S results.

## 2019-12-24 LAB — URINE CULTURE
MICRO NUMBER:: 11096950
SPECIMEN QUALITY:: ADEQUATE

## 2020-02-21 ENCOUNTER — Other Ambulatory Visit: Payer: Self-pay

## 2020-02-21 ENCOUNTER — Ambulatory Visit (LOCAL_COMMUNITY_HEALTH_CENTER): Payer: Self-pay

## 2020-02-21 DIAGNOSIS — Z23 Encounter for immunization: Secondary | ICD-10-CM

## 2020-02-29 NOTE — Progress Notes (Signed)
MMR given d/t low titer; tolerated well Aileen Fass, RN

## 2020-06-01 ENCOUNTER — Telehealth: Payer: Self-pay | Admitting: Obstetrics and Gynecology

## 2020-06-01 NOTE — Telephone Encounter (Signed)
Patient coming in on Wed. 4/6 for Mirena removal/reinsertion with SDJ at 9:30.

## 2020-06-05 NOTE — Telephone Encounter (Signed)
Noted. Mirena reserved for this patient. 

## 2020-06-07 ENCOUNTER — Other Ambulatory Visit: Payer: Self-pay

## 2020-06-07 ENCOUNTER — Other Ambulatory Visit (HOSPITAL_COMMUNITY)
Admission: RE | Admit: 2020-06-07 | Discharge: 2020-06-07 | Disposition: A | Discharge status: Home / Self Care | Payer: No Typology Code available for payment source | Source: Ambulatory Visit | Attending: Obstetrics and Gynecology | Admitting: Obstetrics and Gynecology

## 2020-06-07 ENCOUNTER — Encounter: Payer: Self-pay | Admitting: Obstetrics and Gynecology

## 2020-06-07 ENCOUNTER — Ambulatory Visit (INDEPENDENT_AMBULATORY_CARE_PROVIDER_SITE_OTHER): Payer: 59 | Admitting: Obstetrics and Gynecology

## 2020-06-07 VITALS — BP 122/74 | Ht 66.0 in | Wt 203.0 lb

## 2020-06-07 DIAGNOSIS — Z1331 Encounter for screening for depression: Secondary | ICD-10-CM | POA: Diagnosis not present

## 2020-06-07 DIAGNOSIS — Z30430 Encounter for insertion of intrauterine contraceptive device: Secondary | ICD-10-CM

## 2020-06-07 DIAGNOSIS — N920 Excessive and frequent menstruation with regular cycle: Secondary | ICD-10-CM

## 2020-06-07 DIAGNOSIS — Z1339 Encounter for screening examination for other mental health and behavioral disorders: Secondary | ICD-10-CM | POA: Diagnosis not present

## 2020-06-07 DIAGNOSIS — R87610 Atypical squamous cells of undetermined significance on cytologic smear of cervix (ASC-US): Secondary | ICD-10-CM | POA: Diagnosis not present

## 2020-06-07 DIAGNOSIS — R87810 Cervical high risk human papillomavirus (HPV) DNA test positive: Secondary | ICD-10-CM | POA: Insufficient documentation

## 2020-06-07 DIAGNOSIS — Z30433 Encounter for removal and reinsertion of intrauterine contraceptive device: Secondary | ICD-10-CM | POA: Diagnosis not present

## 2020-06-07 DIAGNOSIS — Z01419 Encounter for gynecological examination (general) (routine) without abnormal findings: Secondary | ICD-10-CM

## 2020-06-07 DIAGNOSIS — N946 Dysmenorrhea, unspecified: Secondary | ICD-10-CM | POA: Diagnosis not present

## 2020-06-07 MED ORDER — MIRENA (52 MG) 20 MCG/24HR IU IUD: 1.0000 | INTRAUTERINE_SYSTEM | Freq: Once | INTRAUTERINE | 0 refills | Status: DC

## 2020-06-07 NOTE — Progress Notes (Signed)
Gynecology Annual Exam  PCP: Tarri Fuller, FNP  Chief Complaint  Patient presents with  . Annual Exam   History of Present Illness:  [name] is a 30 y.o. [phone] who LMP was No LMP recorded. (Menstrual status: IUD)., presents today for her annual examination.  Her menses are regular menses, coming every 26-28 days, lasting 6-10 days.  This has been going on for the past year.  Her IUD was placed about 5 years ago.  Her menses are not as painful as when she did not have her IUD.  She is sexually active.  Last Pap: 05/2019  Results were: atypical squamous cellularity of undetermined significance (ASCUS) /neg HPV DNA positive,  Colposcopy 06/2019: CIN I Hx of STDs: HPV  There is no FH of breast cancer. There is no FH of ovarian cancer. The patient does do self-breast exams.  Tobacco use: The patient denies current or previous tobacco use. Alcohol use: none Exercise: moderately active, 3-4 times per week  The patient wears seatbelts: yes.   The patient reports that domestic violence in her life is absent.   Past Medical History:  Diagnosis Date  . Complication of anesthesia    GETS AGGRESSIVE   . Endometriosis   . Idiopathic intracranial hypertension 04/2018  . Wisdom teeth extracted     Past Surgical History:  Procedure Laterality Date  . CYST EXCISION    . DIAGNOSTIC LAPAROSCOPY    . LUMBAR PUNCTURE  04/2018   x3  . ORIF ANKLE FRACTURE Left 09/07/2015   Procedure: OPEN REDUCTION INTERNAL FIXATION (ORIF) ANKLE FRACTURE;  Surgeon: Kennedy Bucker, MD;  Location: [facility] ORS;  Service: Orthopedics;  Laterality: Left;  . TONSILLECTOMY    . TONSILLECTOMY AND ADENOIDECTOMY      Prior to Admission medications   Medication Sig Start Date End Date Taking? Authorizing Provider  levonorgestrel (MIRENA, 52 MG,) 20 MCG/24HR IUD 1 Intra Uterine Device (1 each total) once for 1 dose by Intrauterine route. 01/16/17 01/16/17  Copland, Ilona Sorrel, PA-C    Allergies  Allergen  Reactions  . Septra [Sulfamethoxazole-Trimethoprim] Rash    Obstetric History: [id number]  Social History   Socioeconomic History  . Marital status: Single    Spouse name: Not on file  . Number of children: Not on file  . Years of education: Not on file  . Highest education level: Bachelor's degree (e.g., BA, AB, BS)  Occupational History  . Occupation: Designer, jewellery  Tobacco Use  . Smoking status: Never Smoker  . Smokeless tobacco: Never Used  Vaping Use  . Vaping Use: Never used  Substance and Sexual Activity  . Alcohol use: Never    Comment: possibly 1 x per year  . Drug use: No  . Sexual activity: Yes    Birth control/protection: I.U.D.    Comment: Mirena  Other Topics Concern  . Not on file  Social History Narrative  . Not on file   Social Determinants of Health   Financial Resource Strain: Not on file  Food Insecurity: Not on file  Transportation Needs: Not on file  Physical Activity: Not on file  Stress: Not on file  Social Connections: Not on file  Intimate Partner Violence: Not on file    Family History  Problem Relation Age of Onset  . Healthy Mother   . Healthy Father   . Healthy Brother   . Atrial fibrillation Maternal Grandmother   . Lung cancer Paternal Grandmother   . Lung cancer Paternal Grandfather   .  Sudden death Brother   . Breast cancer Other     Review of Systems  Constitutional: Negative.   HENT: Negative.   Eyes: Negative.   Respiratory: Negative.   Cardiovascular: Negative.   Gastrointestinal: Negative.   Genitourinary: Negative.   Musculoskeletal: Negative.   Skin: Negative.   Neurological: Negative.   Psychiatric/Behavioral: Negative.      Physical Exam BP 122/74   Ht 5\' 6"  (1.676 m)   Wt 203 lb (92.1 kg)   BMI 32.77 kg/m    Physical Exam Constitutional:      General: She is not in acute distress.    Appearance: Normal appearance. She is well-developed.  Genitourinary:     Vulva and bladder normal.     Right  Labia: No rash, tenderness, lesions, skin changes or Bartholin's cyst.    Left Labia: No tenderness, lesions, skin changes, Bartholin's cyst or rash.    No inguinal adenopathy present in the right or left side.    Pelvic Tanner Score: 5/5.    No vaginal discharge, erythema, tenderness or bleeding.      Right Adnexa: not tender, not full and no mass present.    Left Adnexa: not tender, not full and no mass present.    No cervical motion tenderness, discharge, lesion or polyp.     Uterus is not enlarged or tender.     No uterine mass detected.    Pelvic exam was performed with patient in the lithotomy position.  Breasts:     Right: No inverted nipple, mass, nipple discharge, skin change or tenderness.     Left: No inverted nipple, mass, nipple discharge, skin change or tenderness.    HENT:     Head: Normocephalic and atraumatic.  Eyes:     General: No scleral icterus.    Conjunctiva/sclera: Conjunctivae normal.  Neck:     Thyroid: No thyromegaly.  Cardiovascular:     Rate and Rhythm: Normal rate and regular rhythm.     Heart sounds: No murmur heard. No friction rub. No gallop.   Pulmonary:     Effort: Pulmonary effort is normal. No respiratory distress.     Breath sounds: Normal breath sounds. No wheezing or rales.  Abdominal:     General: Bowel sounds are normal. There is no distension.     Palpations: Abdomen is soft. There is no mass.     Tenderness: There is no abdominal tenderness. There is no guarding or rebound.     Hernia: There is no hernia in the left inguinal area or right inguinal area.  Musculoskeletal:        General: No swelling or tenderness. Normal range of motion.     Cervical back: Normal range of motion and neck supple.  Lymphadenopathy:     Cervical: No cervical adenopathy.     Lower Body: No right inguinal adenopathy. No left inguinal adenopathy.  Neurological:     General: No focal deficit present.     Mental Status: She is alert and oriented to  person, place, and time.     Cranial Nerves: No cranial nerve deficit.  Skin:    General: Skin is warm and dry.     Findings: No erythema or rash.  Psychiatric:        Mood and Affect: Mood normal.        Behavior: Behavior normal.        Judgment: Judgment normal.    IUD Removal  Patient identified, informed consent performed, consent signed.  Patient was in the dorsal lithotomy position, normal external genitalia was noted.  A speculum was placed in the patient's vagina, normal discharge was noted, no lesions. The cervix was visualized, no lesions, no abnormal discharge.  The strings of the IUD were grasped and pulled using ring forceps. The IUD was removed in its entirety. Patient tolerated the procedure well.    IUD Insertion Procedure Note Patient identified, informed consent performed, consent signed.   Discussed risks of irregular bleeding, cramping, infection, malpositioning, expulsion or uterine perforation of the IUD (1:1000 placements)  which may require further procedure such as laparoscopy.  IUD while effective at preventing pregnancy do not prevent transmission of sexually transmitted diseases and use of barrier methods for this purpose was discussed. Time out was performed.  Urine pregnancy test negative.  Speculum already placed in the vagina.  Cervix previously visualized.  Cleaned with Betadine x 2.  Grasped anteriorly with a single tooth tenaculum.  Uterus sounded to 7.5 cm. IUD placed per manufacturer's recommendations.  Strings trimmed to 3 cm. Tenaculum was removed, good hemostasis noted.  Patient tolerated procedure well.   Patient was given post-procedure instructions.  She was advised to have backup contraception for one week.  Patient was also asked to check IUD strings periodically and follow up in 4 weeks for IUD check.   Female chaperone present for pelvic and breast  portions of the physical exam  Results: AUDIT Questionnaire (screen for alcoholism): 0 PHQ-9:  0   Assessment: 30 y.o. G56P2002 female here for routine annual gynecologic examination  Plan: Problem List Items Addressed This Visit   None   Visit Diagnoses    Women's annual routine gynecological examination    -  Primary   Relevant Medications   levonorgestrel (MIRENA, 52 MG,) 20 MCG/24HR IUD   Other Relevant Orders   Cytology - PAP   Screening for depression       Screening for alcoholism       ASCUS with positive high risk HPV cervical       Relevant Orders   Cytology - PAP   Menorrhagia with regular cycle       Relevant Medications   levonorgestrel (MIRENA, 52 MG,) 20 MCG/24HR IUD   Dysmenorrhea       Relevant Medications   levonorgestrel (MIRENA, 52 MG,) 20 MCG/24HR IUD   Encounter for IUD insertion       Encounter for removal and reinsertion of intrauterine contraceptive device (IUD)       Relevant Medications   levonorgestrel (MIRENA, 52 MG,) 20 MCG/24HR IUD      Screening: -- Blood pressure screen normal -- Weight screening: overweight: continue to monitor -- Depression screening negative (PHQ-9) -- Nutrition: normal -- cholesterol screening: not due for screening -- osteoporosis screening: not due -- tobacco screening: not using -- alcohol screening: AUDIT questionnaire indicates low-risk usage. -- family history of breast cancer screening: done. not at high risk. -- no evidence of domestic violence or intimate partner violence. -- STD screening: gonorrhea/chlamydia NAAT not collected per patient request. -- pap smear collected per ASCCP guidelines  IUD replacement due to heavy, painful menses.  FDA approval is for 5 years for heavy menses. Since, she has had the IUD for 5 years, the IUD was replaced.    Thomasene Mohair, MD 06/07/2020 10:41 AM

## 2020-06-12 LAB — CYTOLOGY - PAP
Comment: NEGATIVE
High risk HPV: NEGATIVE

## 2020-07-03 ENCOUNTER — Other Ambulatory Visit: Payer: Self-pay

## 2020-07-03 ENCOUNTER — Ambulatory Visit: Payer: 59 | Admitting: Obstetrics and Gynecology

## 2020-07-03 ENCOUNTER — Encounter: Payer: Self-pay | Admitting: Obstetrics and Gynecology

## 2020-07-03 VITALS — BP 126/84 | Ht 65.0 in | Wt 199.0 lb

## 2020-07-03 DIAGNOSIS — Z30431 Encounter for routine checking of intrauterine contraceptive device: Secondary | ICD-10-CM | POA: Diagnosis not present

## 2020-07-03 NOTE — Progress Notes (Signed)
   IUD String Check  Subjctive: Ms. SUHAILA TROIANO presents for IUD string check.  She had a Mirena placed 1 month ago.  Since placement of her IUD she had some vaginal bleeding (menstrual).  She denies cramping or discomfort.  She has had intercourse since placement.  She denies any fever, chills, nausea, vomiting, or other complaints.    Objective: BP 126/84   Ht 5\' 5"  (1.651 m)   Wt 199 lb (90.3 kg)   BMI 33.12 kg/m  Physical Exam Constitutional:      General: She is not in acute distress.    Appearance: Normal appearance.  Genitourinary:     Bladder and urethral meatus normal.     No lesions in the vagina.     Right Labia: No rash, tenderness, lesions or skin changes.    Left Labia: No tenderness, lesions, skin changes or rash.    No inguinal adenopathy present in the right or left side.    Pelvic Tanner Score: 5/5.    No vaginal discharge, erythema, bleeding or ulceration.     No vaginal prolapse present.     Right Adnexa: not tender, not full and no mass present.    Left Adnexa: not tender, not full and no mass present.    No cervical motion tenderness, discharge, friability, lesion or polyp.     IUD strings visualized.     Uterus is not enlarged, fixed or tender.     Uterus is anteverted.  HENT:     Head: Normocephalic and atraumatic.  Eyes:     General: No scleral icterus.    Conjunctiva/sclera: Conjunctivae normal.  Lymphadenopathy:     Lower Body: No right inguinal adenopathy. No left inguinal adenopathy.  Neurological:     General: No focal deficit present.     Mental Status: She is alert and oriented to person, place, and time.     Cranial Nerves: No cranial nerve deficit.  Psychiatric:        Mood and Affect: Mood normal.        Behavior: Behavior normal.        Judgment: Judgment normal.     Female chaperone was present for the entirety of the pelvic exam  Assessment: 30 y.o. year old female status post prior Mirena IUD placement 4 week ago, doing  well.  Plan: 1.  The patient was given instructions to check her IUD strings monthly and call with any problems or concerns.  She should call for fevers, chills, abnormal vaginal discharge, pelvic pain, or other complaints. 2.  She will return for a annual exam in 1 year.  All questions answered.  23 minutes spent in face to face discussion with > 50% spent in counseling, management, and coordination of care for her newly-placed IUD.  Risks and benefits of IUD discussed including the risks of irregular bleeding, cramping, infection, malpositioning, expulsion, which may require further procedures such as laparoscopy.  IUDs while effective at preventing pregnancy do not prevent transmission of sexually transmitted diseases and use of barrier methods for this purpose was discussed.  Low overall incidence of failure with 99.7% efficacy rate in typical use.    26, MD 07/03/2020 8:56 AM

## 2020-07-21 ENCOUNTER — Ambulatory Visit: Payer: Self-pay | Admitting: *Deleted

## 2020-07-21 NOTE — Telephone Encounter (Signed)
C/o rectal bleeding x 2 weeks. Bleeding noted when passing stool and with urination. Varies on amount of bright red blood passed. Denies lightheadedness, dizziness, N/V, rapid pulse. Denies diarrhea, or constipation. Has had rectal bleeding in the past after giving birth 1-2 days but not this long. Encouraged patient to go to ED for any increase in bleeding. Patient declined and reports if it gets bad she will go. Care advise given. appt scheduled 07/24/20 with Dr. Althea Charon. Patient verbalized understanding of care advise and to go to [facility] or ED if symptoms worsen.   Reason for Disposition . MODERATE rectal bleeding (small blood clots, passing blood without stool, or toilet water turns red)  Answer Assessment - Initial Assessment Questions 1. APPEARANCE of BLOOD: "What color is it?" "Is it passed separately, on the surface of the stool, or mixed in with the stool?"      Bright red, some mixed with stool and some not  2. AMOUNT: "How much blood was passed?"      Unable to give specific amount  3. FREQUENCY: "How many times has blood been passed with the stools?"      Blood noted from rectum with stool and with urination 4. ONSET: "When was the blood first seen in the stools?" (Days or weeks)      2 weeks ago  5. DIARRHEA: "Is there also some diarrhea?" If Yes, ask: "How many diarrhea stools in the past 24 hours?"      no 6. CONSTIPATION: "Do you have constipation?" If Yes, ask: "How bad is it?"     no 7. RECURRENT SYMPTOMS: "Have you had blood in your stools before?" If Yes, ask: "When was the last time?" and "What happened that time?"      Yes after giving birth but not this long  8. BLOOD THINNERS: "Do you take any blood thinners?" (e.g., Coumadin/warfarin, Pradaxa/dabigatran, aspirin)     no 9. OTHER SYMPTOMS: "Do you have any other symptoms?"  (e.g., abdomen pain, vomiting, dizziness, fever)     No  10. PREGNANCY: "Is there any chance you are pregnant?" "When was your last menstrual  period?"       No IUD  Protocols used: RECTAL BLEEDING-A-AH

## 2020-07-24 ENCOUNTER — Encounter: Payer: Self-pay | Admitting: Internal Medicine

## 2020-07-24 ENCOUNTER — Ambulatory Visit (INDEPENDENT_AMBULATORY_CARE_PROVIDER_SITE_OTHER): Payer: No Typology Code available for payment source | Admitting: Internal Medicine

## 2020-07-24 ENCOUNTER — Other Ambulatory Visit: Payer: Self-pay

## 2020-07-24 ENCOUNTER — Ambulatory Visit: Payer: Self-pay | Admitting: Family Medicine

## 2020-07-24 VITALS — BP 139/62 | HR 77 | Temp 97.8°F | Resp 18 | Ht 65.0 in | Wt 199.0 lb

## 2020-07-24 DIAGNOSIS — E6609 Other obesity due to excess calories: Secondary | ICD-10-CM | POA: Diagnosis not present

## 2020-07-24 DIAGNOSIS — K6289 Other specified diseases of anus and rectum: Secondary | ICD-10-CM | POA: Diagnosis not present

## 2020-07-24 DIAGNOSIS — Z6833 Body mass index (BMI) 33.0-33.9, adult: Secondary | ICD-10-CM

## 2020-07-24 DIAGNOSIS — L290 Pruritus ani: Secondary | ICD-10-CM | POA: Diagnosis not present

## 2020-07-24 DIAGNOSIS — K921 Melena: Secondary | ICD-10-CM | POA: Diagnosis not present

## 2020-07-24 MED ORDER — HYDROCORTISONE ACETATE 25 MG RE SUPP: 25.0000 mg | Freq: Two times a day (BID) | RECTAL | 0 refills | Status: DC

## 2020-07-24 NOTE — Patient Instructions (Signed)
Rectal Bleeding  Rectal bleeding is when blood comes out of the opening of the butt (anus). People with this kind of bleeding may notice bright red blood in their underwear or in the toilet after they poop (have a bowel movement). They may also have blood mixed with their poop (stool), or dark red or black poop. Rectal bleeding is often a sign that something is wrong. This condition can be caused by many things. It needs to be checked by a doctor. Your doctor will do tests to know what is causing your condition. Follow these instructions at home: Watch for any changes in your condition. Take these actions to help with bleeding and discomfort: Medicines  Take over-the-counter and prescription medicines only as told by your doctor.  Ask your doctor about changing or stopping your normal medicines. This is important if you are taking blood thinners. Medicines that thin the blood can make rectal bleeding worse. Managing constipation Your condition may cause trouble pooping (constipation). To prevent or treat trouble pooping, or to help make your poop soft, you may need to:  Drink enough fluid to keep your pee (urine) pale yellow.  Take over-the-counter or prescription medicines.  Eat foods that are high in fiber. These include beans, whole grains, and fresh fruits and vegetables.  Limit foods that are high in fat and sugar. These include fried or sweet foods.   General instructions  Try not to strain when you poop.  Try taking a warm bath. This may help with pain.  Keep all follow-up visits as told by your doctor. This is important. Contact a doctor if:  You have pain or swelling in your belly (abdomen).  You have a fever.  You feel weak.  You feel like you may vomit.  You cannot poop. Get help right away if:  You have new bleeding.  You have more bleeding than before.  You have black or dark red poop.  You vomit blood or something that looks like coffee grounds.  You  pass out (faint).  You have very bad pain in your butt. Summary  Rectal bleeding is when blood comes out of the opening of the butt. This bleeding is often a sign that something is wrong.  Eat a diet that is high in fiber. This will help to keep your poop soft.  Talk to your doctor if you take medicines that thin the blood. These medicines can make bleeding worse.  Get help right away if you have new or more bleeding, black or dark red poop, or blood in your vomit. Also, get help if you pass out or have very bad pain in your butt. This information is not intended to replace advice given to you by your health care provider. Make sure you discuss any questions you have with your health care provider. Document Revised: 01/20/2019 Document Reviewed: 01/20/2019 Elsevier Patient Education  2021 Elsevier Inc.  

## 2020-07-24 NOTE — Progress Notes (Signed)
Subjective:    Patient ID: Kimberly Lloyd, female    DOB: May 03, 1990, 30 y.o.   MRN: [id number]  HPI  Pt presents to the clinic today with c/o rectal pain, itching and blood in her stool. She noticed this 2 weeks ago.  She describes the pain as "pooping razor blades".  She reports the blood is bright red.  She denies abdominal pain, nausea, vomiting, constipation or diarrhea.  She has had blood in her stool in the past and thinks it could be hemorrhoids but is unsure.  She has no family history of colon cancer.  Review of Systems      Past Medical History:  Diagnosis Date  . Complication of anesthesia    GETS AGGRESSIVE   . Endometriosis   . Idiopathic intracranial hypertension 04/2018  . Wisdom teeth extracted     Current Outpatient Medications  Medication Sig Dispense Refill  . acetaZOLAMIDE (DIAMOX) 500 MG capsule Take 500 mg by mouth daily.  (Patient not taking: Reported on 06/07/2020)    . diazepam (VALIUM) 5 MG tablet Take 1 tablet (5 mg total) by mouth every 6 (six) hours as needed for anxiety. (Patient not taking: Reported on 06/07/2020) 4 tablet 0  . levonorgestrel (MIRENA, 52 MG,) 20 MCG/24HR IUD 1 Intra Uterine Device (1 each total) by Intrauterine route once for 1 dose. 1 Intra Uterine Device 0  . topiramate (TOPAMAX) 25 MG tablet Take 25 mg by mouth daily.  (Patient not taking: Reported on 06/07/2020)     No current facility-administered medications for this visit.    Allergies  Allergen Reactions  . Septra [Sulfamethoxazole-Trimethoprim] Rash    Family History  Problem Relation Age of Onset  . Healthy Mother   . Healthy Father   . Healthy Brother   . Atrial fibrillation Maternal Grandmother   . Lung cancer Paternal Grandmother   . Lung cancer Paternal Grandfather   . Sudden death Brother   . Breast cancer Other     Social History   Socioeconomic History  . Marital status: Single    Spouse name: Not on file  . Number of children: Not on file  . Years  of education: Not on file  . Highest education level: Bachelor's degree (e.g., BA, AB, BS)  Occupational History  . Occupation: Designer, jewellery  Tobacco Use  . Smoking status: Never Smoker  . Smokeless tobacco: Never Used  Vaping Use  . Vaping Use: Never used  Substance and Sexual Activity  . Alcohol use: Never    Comment: possibly 1 x per year  . Drug use: No  . Sexual activity: Yes    Birth control/protection: I.U.D.    Comment: Mirena  Other Topics Concern  . Not on file  Social History Narrative  . Not on file   Social Determinants of Health   Financial Resource Strain: Not on file  Food Insecurity: Not on file  Transportation Needs: Not on file  Physical Activity: Not on file  Stress: Not on file  Social Connections: Not on file  Intimate Partner Violence: Not on file     Constitutional: Denies fever, malaise, fatigue, headache or abrupt weight changes.  Respiratory: Denies difficulty breathing, shortness of breath, cough or sputum production.   Cardiovascular: Denies chest pain, chest tightness, palpitations or swelling in the hands or feet.  Gastrointestinal: Pt reports rectal pain, itching blood in her stool. Denies abdominal pain, bloating, constipation, diarrhea.  Neurological: Denies dizziness, difficulty with memory, difficulty with speech or  problems with balance and coordination.    No other specific complaints in a complete review of systems (except as listed in HPI above).  Objective:   Physical Exam  BP 139/62 (BP Location: Right Arm, Patient Position: Sitting, Cuff Size: Large)   Pulse 77   Temp 97.8 F (36.6 C) (Temporal)   Resp 18   Ht 5\' 5"  (1.651 m)   Wt 199 lb (90.3 kg)   SpO2 100%   BMI 33.12 kg/m   Wt Readings from Last 3 Encounters:  07/03/20 199 lb (90.3 kg)  06/07/20 203 lb (92.1 kg)  12/22/19 211 lb (95.7 kg)    General: Appears her stated age, obese, in NAD. HEENT: Head: normal shape and size; Eyes: sclera white and EOMs  intact; Cardiovascular: Normal rate and rhythm. S1,S2 noted.  No murmur, rubs or gallops noted.  Pulmonary/Chest: Normal effort and positive vesicular breath sounds. No respiratory distress. No wheezes, rales or ronchi noted.  Abdomen: Soft and nontender. Normal bowel sounds. No distention or masses noted.  Rectal: No external mass or fissure noted.  Normal rectal tone.  Hemoccult positive stool. Musculoskeletal: No difficulty with gait.  Neurological: Alert and oriented.    BMET    Component Value Date/Time   NA 136 04/04/2018 1014   K 3.6 04/04/2018 1014   CL 107 04/04/2018 1014   CO2 21 (L) 04/04/2018 1014   GLUCOSE 167 (H) 04/04/2018 1014   BUN 8 04/04/2018 1014   CREATININE 0.70 04/04/2018 1014   CALCIUM 8.2 (L) 04/04/2018 1014   GFRNONAA >60 04/04/2018 1014   GFRAA >60 04/04/2018 1014    Lipid Panel  No results found for: CHOL, TRIG, HDL, CHOLHDL, VLDL, LDLCALC  CBC    Component Value Date/Time   WBC 8.2 06/24/2019 0811   RBC 4.24 06/24/2019 0811   HGB 12.6 06/24/2019 0811   HGB 10.9 (L) 02/25/2012 0258   HCT 37.7 06/24/2019 0811   HCT 32.0 (L) 02/26/2012 0616   PLT 345 06/24/2019 0811   PLT 242 02/25/2012 0258   MCV 88.9 06/24/2019 0811   MCV 91 02/25/2012 0258   MCH 29.7 06/24/2019 0811   MCHC 33.4 06/24/2019 0811   RDW 12.2 06/24/2019 0811   RDW 12.8 02/25/2012 0258   LYMPHSABS 1.1 04/04/2018 1014   LYMPHSABS 1.9 02/25/2012 0258   MONOABS 0.4 04/04/2018 1014   MONOABS 1.3 (H) 02/25/2012 0258   EOSABS 0.1 04/04/2018 1014   EOSABS 0.1 02/25/2012 0258   BASOSABS 0.0 04/04/2018 1014   BASOSABS 0.1 02/25/2012 0258    Hgb A1C No results found for: HGBA1C         Assessment & Plan:  Rectal Pain, Itching and Blood in Stool:  Likely internal hemorrhoids Rx for Anusol suppository 25 mg twice daily x6 days Encouraged high-fiber diet and adequate water intake If no improvement in symptoms will refer to GI for possible colonoscopy  Return precautions  discussed   02/27/2012, NP This visit occurred during the SARS-CoV-2 public health emergency.  Safety protocols were in place, including screening questions prior to the visit, additional usage of staff PPE, and extensive cleaning of exam room while observing appropriate contact time as indicated for disinfecting solutions.

## 2020-11-27 ENCOUNTER — Encounter: Payer: Self-pay | Admitting: Internal Medicine

## 2020-11-27 ENCOUNTER — Ambulatory Visit (INDEPENDENT_AMBULATORY_CARE_PROVIDER_SITE_OTHER): Payer: No Typology Code available for payment source | Admitting: Internal Medicine

## 2020-11-27 ENCOUNTER — Other Ambulatory Visit: Payer: Self-pay

## 2020-11-27 VITALS — BP 122/78 | HR 96 | Temp 98.6°F | Resp 17 | Ht 65.0 in | Wt 211.0 lb

## 2020-11-27 DIAGNOSIS — E6609 Other obesity due to excess calories: Secondary | ICD-10-CM | POA: Insufficient documentation

## 2020-11-27 DIAGNOSIS — R8290 Unspecified abnormal findings in urine: Secondary | ICD-10-CM

## 2020-11-27 DIAGNOSIS — R3989 Other symptoms and signs involving the genitourinary system: Secondary | ICD-10-CM

## 2020-11-27 DIAGNOSIS — Z6836 Body mass index (BMI) 36.0-36.9, adult: Secondary | ICD-10-CM | POA: Insufficient documentation

## 2020-11-27 DIAGNOSIS — R3915 Urgency of urination: Secondary | ICD-10-CM

## 2020-11-27 DIAGNOSIS — G4482 Headache associated with sexual activity: Secondary | ICD-10-CM

## 2020-11-27 DIAGNOSIS — Z23 Encounter for immunization: Secondary | ICD-10-CM

## 2020-11-27 DIAGNOSIS — R350 Frequency of micturition: Secondary | ICD-10-CM | POA: Diagnosis not present

## 2020-11-27 DIAGNOSIS — G932 Benign intracranial hypertension: Secondary | ICD-10-CM

## 2020-11-27 DIAGNOSIS — Z6835 Body mass index (BMI) 35.0-35.9, adult: Secondary | ICD-10-CM

## 2020-11-27 LAB — POCT URINALYSIS DIPSTICK
Bilirubin, UA: NEGATIVE
Glucose, UA: NEGATIVE
Ketones, UA: NEGATIVE
Nitrite, UA: NEGATIVE
Protein, UA: NEGATIVE
Spec Grav, UA: 1.015 (ref 1.010–1.025)
Urobilinogen, UA: 0.2 E.U./dL
pH, UA: 6 (ref 5.0–8.0)

## 2020-11-27 MED ORDER — NITROFURANTOIN MONOHYD MACRO 100 MG PO CAPS: 100.0000 mg | ORAL_CAPSULE | Freq: Two times a day (BID) | ORAL | 0 refills | Status: DC

## 2020-11-27 NOTE — Patient Instructions (Signed)
Urinary Tract Infection, Adult A urinary tract infection (UTI) is an infection of any part of the urinary tract. The urinary tract includes the kidneys, ureters, bladder, and urethra. These organs make, store, and get rid of urine in the body. An upper UTI affects the ureters and kidneys. A lower UTI affects the bladder and urethra. What are the causes? Most urinary tract infections are caused by bacteria in your genital area around your urethra, where urine leaves your body. These bacteria grow and cause inflammation of your urinary tract. What increases the risk? You are more likely to develop this condition if: You have a urinary catheter that stays in place. You are not able to control when you urinate or have a bowel movement (incontinence). You are female and you: Use a spermicide or diaphragm for birth control. Have low estrogen levels. Are pregnant. You have certain genes that increase your risk. You are sexually active. You take antibiotic medicines. You have a condition that causes your flow of urine to slow down, such as: An enlarged prostate, if you are female. Blockage in your urethra. A kidney stone. A nerve condition that affects your bladder control (neurogenic bladder). Not getting enough to drink, or not urinating often. You have certain medical conditions, such as: Diabetes. A weak disease-fighting system (immunesystem). Sickle cell disease. Gout. Spinal cord injury. What are the signs or symptoms? Symptoms of this condition include: Needing to urinate right away (urgency). Frequent urination. This may include small amounts of urine each time you urinate. Pain or burning with urination. Blood in the urine. Urine that smells bad or unusual. Trouble urinating. Cloudy urine. Vaginal discharge, if you are female. Pain in the abdomen or the lower back. You may also have: Vomiting or a decreased appetite. Confusion. Irritability or tiredness. A fever or  chills. Diarrhea. The first symptom in older adults may be confusion. In some cases, they may not have any symptoms until the infection has worsened. How is this diagnosed? This condition is diagnosed based on your medical history and a physical exam. You may also have other tests, including: Urine tests. Blood tests. Tests for STIs (sexually transmitted infections). If you have had more than one UTI, a cystoscopy or imaging studies may be done to determine the cause of the infections. How is this treated? Treatment for this condition includes: Antibiotic medicine. Over-the-counter medicines to treat discomfort. Drinking enough water to stay hydrated. If you have frequent infections or have other conditions such as a kidney stone, you may need to see a health care provider who specializes in the urinary tract (urologist). In rare cases, urinary tract infections can cause sepsis. Sepsis is a life-threatening condition that occurs when the body responds to an infection. Sepsis is treated in the hospital with IV antibiotics, fluids, and other medicines. Follow these instructions at home: Medicines Take over-the-counter and prescription medicines only as told by your health care provider. If you were prescribed an antibiotic medicine, take it as told by your health care provider. Do not stop using the antibiotic even if you start to feel better. General instructions Make sure you: Empty your bladder often and completely. Do not hold urine for long periods of time. Empty your bladder after sex. Wipe from front to back after urinating or having a bowel movement if you are female. Use each tissue only one time when you wipe. Drink enough fluid to keep your urine pale yellow. Keep all follow-up visits. This is important. Contact a health care provider   if: Your symptoms do not get better after 1-2 days. Your symptoms go away and then return. Get help right away if: You have severe pain in your  back or your lower abdomen. You have a fever or chills. You have nausea or vomiting. Summary A urinary tract infection (UTI) is an infection of any part of the urinary tract, which includes the kidneys, ureters, bladder, and urethra. Most urinary tract infections are caused by bacteria in your genital area. Treatment for this condition often includes antibiotic medicines. If you were prescribed an antibiotic medicine, take it as told by your health care provider. Do not stop using the antibiotic even if you start to feel better. Keep all follow-up visits. This is important. This information is not intended to replace advice given to you by your health care provider. Make sure you discuss any questions you have with your health care provider. Document Revised: 10/01/2019 Document Reviewed: 10/01/2019 Elsevier Patient Education  2022 Elsevier Inc.  

## 2020-11-27 NOTE — Progress Notes (Signed)
HPI  Pt presents to the clinic today with c/o bladder pressure, urinary urgency, frequency and odor. She reports this started 3 days ago. She denies dysuria, blood in her urine, vaginal discharge or irritation. She denies fever, chills, nausea or low back pain. She has not taken anything OTC for this.  She also reports intense headache with orgasm. She reports this started 8-9 days ago. She reports it occurs with intercourse and with masturbation. She has never had this happen before. She does have a history of intracranial hypertension and recently stopped drinking energy drinks and is not sure if this is a contributing factor or not.   Review of Systems  Past Medical History:  Diagnosis Date   Complication of anesthesia    GETS AGGRESSIVE    Endometriosis    Idiopathic intracranial hypertension 04/2018   Wisdom teeth extracted     Family History  Problem Relation Age of Onset   Healthy Mother    Healthy Father    Healthy Brother    Atrial fibrillation Maternal Grandmother    Lung cancer Paternal Grandmother    Lung cancer Paternal Grandfather    Sudden death Brother    Breast cancer Other     Social History   Socioeconomic History   Marital status: Single    Spouse name: Not on file   Number of children: Not on file   Years of education: Not on file   Highest education level: Bachelor's degree (e.g., BA, AB, BS)  Occupational History   Occupation: Designer, jewellery  Tobacco Use   Smoking status: Never   Smokeless tobacco: Never  Vaping Use   Vaping Use: Never used  Substance and Sexual Activity   Alcohol use: Never    Comment: possibly 1 x per year   Drug use: No   Sexual activity: Yes    Birth control/protection: I.U.D.    Comment: Mirena  Other Topics Concern   Not on file  Social History Narrative   Not on file   Social Determinants of Health   Financial Resource Strain: Not on file  Food Insecurity: Not on file  Transportation Needs: Not on file   Physical Activity: Not on file  Stress: Not on file  Social Connections: Not on file  Intimate Partner Violence: Not on file    Allergies  Allergen Reactions   Septra [Sulfamethoxazole-Trimethoprim] Rash     Constitutional: Pt reports headache with orgasm. Denies fever, malaise, fatigue, or abrupt weight changes.   GU: Pt reports urgency, frequency, odor and bladder pressure. Denies burning sensation, blood in urine, or discharge. Skin: Denies redness, rashes, lesions or ulcercations.   No other specific complaints in a complete review of systems (except as listed in HPI above).    Objective:   Physical Exam  BP 122/78 (BP Location: Right Arm, Patient Position: Sitting, Cuff Size: Large)   Pulse 96   Temp 98.6 F (37 C) (Temporal)   Resp 17   Ht 5\' 5"  (1.651 m)   Wt 211 lb (95.7 kg)   SpO2 99%   BMI 35.11 kg/m   Wt Readings from Last 3 Encounters:  07/24/20 199 lb (90.3 kg)  07/03/20 199 lb (90.3 kg)  06/07/20 203 lb (92.1 kg)    General: Appears her stated age, obese, in NAD. Cardiovascular: Normal rate and rhythm. S1,S2 noted.   Pulmonary/Chest: Normal effort and positive vesicular breath sounds. No respiratory distress. No wheezes, rales or ronchi noted.  Abdomen: Soft and mildly tender over the  bladder. Normal bowel sounds. No distention or masses noted. No CVA tenderness. Neuro: Alert and oriented. Coordination normal.        Assessment & Plan:   Urgency, Frequency, Urine Odor and Bladder Pressure:  Urinalysis: 3+ leuks, 3+ blood Will send urine culture eRx sent if for Macrobid 100 mg BID x 5 days OK to take AZO OTC Drink plenty of fluids  Orgasmic Headache, Idiopathic Intracranial HTN:  Could be complicated by IIH Can trial Imitrex 5 mg nasal spray for acute symptoms vs Indomethacin or Propranolol prior to sexual activity  RTC as needed or if symptoms persist. Nicki Reaper, NP This visit occurred during the SARS-CoV-2 public health emergency.   Safety protocols were in place, including screening questions prior to the visit, additional usage of staff PPE, and extensive cleaning of exam room while observing appropriate contact time as indicated for disinfecting solutions.

## 2020-11-29 LAB — URINE CULTURE
MICRO NUMBER:: 12423099
SPECIMEN QUALITY:: ADEQUATE

## 2020-12-20 ENCOUNTER — Telehealth: Payer: Self-pay

## 2020-12-28 NOTE — Telephone Encounter (Signed)
Mirena rcvd/charged 06/07/20

## 2021-01-05 NOTE — Telephone Encounter (Signed)
Open in error

## 2021-03-10 ENCOUNTER — Ambulatory Visit
Admission: EM | Admit: 2021-03-10 | Discharge: 2021-03-10 | Disposition: A | Discharge status: Home / Self Care | Payer: PRIVATE HEALTH INSURANCE | Attending: Emergency Medicine | Admitting: Emergency Medicine

## 2021-03-10 ENCOUNTER — Other Ambulatory Visit: Payer: Self-pay

## 2021-03-10 ENCOUNTER — Encounter: Payer: Self-pay | Admitting: Emergency Medicine

## 2021-03-10 DIAGNOSIS — J0100 Acute maxillary sinusitis, unspecified: Secondary | ICD-10-CM

## 2021-03-10 DIAGNOSIS — J069 Acute upper respiratory infection, unspecified: Secondary | ICD-10-CM | POA: Diagnosis not present

## 2021-03-10 MED ORDER — DOXYCYCLINE HYCLATE 100 MG PO CAPS: 100.0000 mg | ORAL_CAPSULE | Freq: Two times a day (BID) | ORAL | 0 refills | Status: DC

## 2021-03-10 NOTE — ED Provider Notes (Signed)
[facility]    CSN: [id number] Arrival date & time: 03/10/21  1132      History   Chief Complaint Chief Complaint  Patient presents with   Nasal Congestion   Cough    HPI Kimberly Lloyd is a 31 y.o. female.   31 year old female, Kimberly Lloyd, presents to urgent care with chief complaint of nasal congestion ,cough,sore throat/hoarseness, headache for a week.  Patient states family has similar signs and symptoms however she is getting worse.  Patient states she is using over-the-counter meds without relief.  Patient denies fever,shortness of breath, chest pain or palpitations.  The history is provided by the patient. No language interpreter was used.   Past Medical History:  Diagnosis Date   Complication of anesthesia    GETS AGGRESSIVE    Endometriosis    Idiopathic intracranial hypertension 04/2018   Wisdom teeth extracted     Patient Active Problem List   Diagnosis Date Noted   Acute non-recurrent maxillary sinusitis 03/10/2021   Acute upper respiratory infection 03/10/2021   Class 2 severe obesity due to excess calories with serious comorbidity and body mass index (BMI) of 35.0 to 35.9 in adult [facility]) 11/27/2020   Anxiety 09/30/2019   Herpes simplex 06/09/2019   Headache disorder 05/12/2018   Pseudotumor cerebri 04/21/2018    Past Surgical History:  Procedure Laterality Date   CYST EXCISION     DIAGNOSTIC LAPAROSCOPY     LUMBAR PUNCTURE  04/2018   x3   ORIF ANKLE FRACTURE Left 09/07/2015   Procedure: OPEN REDUCTION INTERNAL FIXATION (ORIF) ANKLE FRACTURE;  Surgeon: Hessie Knows, MD;  Location: [facility] ORS;  Service: Orthopedics;  Laterality: Left;   TONSILLECTOMY     TONSILLECTOMY AND ADENOIDECTOMY      OB History     Gravida  2   Para  2   Term  2   Preterm  0   AB  0   Living  2      SAB  0   IAB  0   Ectopic  0   Multiple  0   Live Births  2            Home Medications    Prior to Admission medications   Medication  Sig Start Date End Date Taking? Authorizing Provider  doxycycline (VIBRAMYCIN) 100 MG capsule Take 1 capsule (100 mg total) by mouth 2 (two) times daily. [id number]  Yes Murice Barbar, Jeanett Schlein, NP  hydrocortisone (ANUSOL-HC) 25 MG suppository Place 1 suppository (25 mg total) rectally 2 (two) times daily. Patient not taking: Reported on 11/27/2020 07/24/20   Jearld Fenton, NP  levonorgestrel (MIRENA, 52 MG,) 20 MCG/DAY IUD by Intrauterine route. 06/07/20   [provider]  nitrofurantoin, macrocrystal-monohydrate, (MACROBID) 100 MG capsule Take 1 capsule (100 mg total) by mouth 2 (two) times daily. 11/27/20   Jearld Fenton, NP    Family History Family History  Problem Relation Age of Onset   Healthy Mother    Healthy Father    Healthy Brother    Atrial fibrillation Maternal Grandmother    Lung cancer Paternal Grandmother    Lung cancer Paternal Grandfather    Sudden death Brother    Breast cancer Other     Social History Social History   Tobacco Use   Smoking status: Never   Smokeless tobacco: Never  Vaping Use   Vaping Use: Never used  Substance Use Topics   Alcohol use: Never    Comment: possibly  1 x per year   Drug use: No     Allergies   Septra [sulfamethoxazole-trimethoprim]   Review of Systems Review of Systems  Constitutional:  Negative for chills and fever.  HENT:  Positive for congestion, postnasal drip, sinus pressure, sinus pain, sore throat and voice change. Negative for ear pain and rhinorrhea.   Eyes: Negative.   Respiratory:  Positive for cough. Negative for shortness of breath and wheezing.   Cardiovascular:  Negative for chest pain and palpitations.  Gastrointestinal:  Negative for nausea and vomiting.  Endocrine: Negative.   Genitourinary:  Negative for dysuria.  Musculoskeletal:  Negative for myalgias.  Skin:  Negative for rash.  Allergic/Immunologic: Negative.   Neurological:  Negative for headaches.  Hematological: Negative.    Psychiatric/Behavioral: Negative.    All other systems reviewed and are negative.   Physical Exam Triage Vital Signs ED Triage Vitals  Enc Vitals Group     BP      Pulse      Resp      Temp      Temp src      SpO2      Weight      Height      Head Circumference      Peak Flow      Pain Score      Pain Loc      Pain Edu?      Excl. in GC?    No data found.  Updated Vital Signs BP 120/89 (BP Location: Left Arm)    Pulse 62    Temp 98.2 F (36.8 C) (Oral)    Resp 18    Ht 5\' 5"  (1.651 m)    Wt 210 lb 15.7 oz (95.7 kg)    SpO2 98%    BMI 35.11 kg/m   Visual Acuity Right Eye Distance:   Left Eye Distance:   Bilateral Distance:    Right Eye Near:   Left Eye Near:    Bilateral Near:     Physical Exam Vitals and nursing note reviewed.  Constitutional:      General: She is not in acute distress.    Appearance: She is well-developed and well-groomed.  HENT:     Head: Normocephalic.     Right Ear: Tympanic membrane is retracted.     Left Ear: Tympanic membrane is retracted.     Nose: Congestion present.     Mouth/Throat:     Lips: Pink.     Mouth: Mucous membranes are moist.     Pharynx: Oropharynx is clear.  Eyes:     General: Lids are normal.     Conjunctiva/sclera: Conjunctivae normal.     Pupils: Pupils are equal, round, and reactive to light.  Neck:     Trachea: No tracheal deviation.  Cardiovascular:     Rate and Rhythm: Regular rhythm.     Pulses: Normal pulses.     Heart sounds: Normal heart sounds. No murmur heard. Pulmonary:     Effort: Pulmonary effort is normal.     Breath sounds: Normal breath sounds and air entry.  Abdominal:     General: Bowel sounds are normal.     Palpations: Abdomen is soft.     Tenderness: There is no abdominal tenderness.  Musculoskeletal:        General: Normal range of motion.     Cervical back: Normal range of motion.  Lymphadenopathy:     Cervical: No cervical adenopathy.  Skin:  General: Skin is warm and  dry.     Findings: No rash.  Neurological:     General: No focal deficit present.     Mental Status: She is alert and oriented to person, place, and time.     GCS: GCS eye subscore is 4. GCS verbal subscore is 5. GCS motor subscore is 6.  Psychiatric:        Attention and Perception: Attention normal.        Mood and Affect: Mood normal.        Speech: Speech normal.        Behavior: Behavior normal. Behavior is cooperative.     UC Treatments / Results  Labs (all labs ordered are listed, but only abnormal results are displayed) Labs Reviewed - No data to display  EKG   Radiology No results found.  Procedures Procedures (including critical care time)  Medications Ordered in UC Medications - No data to display  Initial Impression / Assessment and Plan / UC Course  I have reviewed the triage vital signs and the nursing notes.  Pertinent labs & imaging results that were available during my care of the patient were reviewed by me and considered in my medical decision making (see chart for details).     Ddx: Acute URI, sinusitis, viral illness, allergies Final Clinical Impressions(s) / UC Diagnoses   Final diagnoses:  Acute upper respiratory infection     Discharge Instructions      Rest,push fluids, take OTC meds for symptom management. Take antibiotic as directed. Follow up with PCP.      ED Prescriptions     Medication Sig Dispense Auth. Provider   doxycycline (VIBRAMYCIN) 100 MG capsule Take 1 capsule (100 mg total) by mouth 2 (two) times daily. 20 capsule Felesia Stahlecker, Jeanett Schlein, NP      PDMP not reviewed this encounter.   Tori Milks, NP [id number]

## 2021-03-10 NOTE — ED Triage Notes (Signed)
Pt c/o nasal congestion, cough, hoarseness. Started about 6 days. Denies fever, shortness of breath.

## 2021-03-10 NOTE — Discharge Instructions (Addendum)
Rest,push fluids, take OTC meds for symptom management. Take antibiotic as directed. Follow up with PCP.

## 2021-04-21 ENCOUNTER — Other Ambulatory Visit: Payer: Self-pay

## 2021-04-21 ENCOUNTER — Encounter: Payer: Self-pay | Admitting: Emergency Medicine

## 2021-04-21 ENCOUNTER — Ambulatory Visit
Admission: EM | Admit: 2021-04-21 | Discharge: 2021-04-21 | Disposition: A | Discharge status: Home / Self Care | Payer: No Typology Code available for payment source | Attending: Internal Medicine | Admitting: Internal Medicine

## 2021-04-21 DIAGNOSIS — J069 Acute upper respiratory infection, unspecified: Secondary | ICD-10-CM | POA: Diagnosis not present

## 2021-04-21 DIAGNOSIS — H6500 Acute serous otitis media, unspecified ear: Secondary | ICD-10-CM | POA: Diagnosis not present

## 2021-04-21 MED ORDER — AMOXICILLIN-POT CLAVULANATE 875-125 MG PO TABS: 1.0000 | ORAL_TABLET | Freq: Two times a day (BID) | ORAL | 0 refills | Status: DC

## 2021-04-21 MED ORDER — FLUTICASONE PROPIONATE 50 MCG/ACT NA SUSP: 2.0000 | Freq: Every day | NASAL | 0 refills | Status: AC

## 2021-04-21 MED ORDER — GUAIFENESIN-CODEINE 100-10 MG/5ML PO SOLN: 5.0000 mL | Freq: Every evening | ORAL | 0 refills | Status: DC

## 2021-04-21 NOTE — ED Triage Notes (Addendum)
Patient c/o sinus congestion and drainage, cough, and bilateral ear pain that started on Tuesday.  Patient denies fevers. Patient does not want a covid test today.

## 2021-04-21 NOTE — ED Provider Notes (Signed)
[facility]    CSN: [id number] Arrival date & time: 04/21/21  0945      History   Chief Complaint Chief Complaint  Patient presents with   Cough   Otalgia   Nasal Congestion    HPI Kimberly Lloyd is a 31 y.o. female who presents with onset of nose congestion, PND, cough and bilateral ear pain x 4 days. She denies a fever. She declined Covid testing. She was at work yesterday and had L ear pain and had one of the MD's look at her ear and was told it was angry and injected, but did not advised to see care then. She states she could not sleep last night due to the pain in L ear and coughing. Has not had a fever. She fully recovered from sinusitis last month.     Past Medical History:  Diagnosis Date   Complication of anesthesia    GETS AGGRESSIVE    Endometriosis    Idiopathic intracranial hypertension 04/2018   Wisdom teeth extracted     Patient Active Problem List   Diagnosis Date Noted   Acute non-recurrent maxillary sinusitis 03/10/2021   Acute upper respiratory infection 03/10/2021   Class 2 severe obesity due to excess calories with serious comorbidity and body mass index (BMI) of 35.0 to 35.9 in adult [facility]) 11/27/2020   Anxiety 09/30/2019   Herpes simplex 06/09/2019   Headache disorder 05/12/2018   Pseudotumor cerebri 04/21/2018    Past Surgical History:  Procedure Laterality Date   CYST EXCISION     DIAGNOSTIC LAPAROSCOPY     LUMBAR PUNCTURE  04/2018   x3   ORIF ANKLE FRACTURE Left 09/07/2015   Procedure: OPEN REDUCTION INTERNAL FIXATION (ORIF) ANKLE FRACTURE;  Surgeon: Kennedy Bucker, MD;  Location: [facility] ORS;  Service: Orthopedics;  Laterality: Left;   TONSILLECTOMY     TONSILLECTOMY AND ADENOIDECTOMY      OB History     Gravida  2   Para  2   Term  2   Preterm  0   AB  0   Living  2      SAB  0   IAB  0   Ectopic  0   Multiple  0   Live Births  2            Home Medications    Prior to Admission medications    Medication Sig Start Date End Date Taking? Authorizing Provider  amoxicillin-clavulanate (AUGMENTIN) 875-125 MG tablet Take 1 tablet by mouth 2 (two) times daily. 04/21/21  Yes Rodriguez-Southworth, Nettie Elm, PA-C  fluticasone (FLONASE) 50 MCG/ACT nasal spray Place 2 sprays into both nostrils daily for 7 days. 04/21/21 04/28/21 Yes Rodriguez-Southworth, Nettie Elm, PA-C  guaiFENesin-codeine 100-10 MG/5ML syrup Take 5 mLs by mouth at bedtime. 04/21/21  Yes Rodriguez-Southworth, Nettie Elm, PA-C  levonorgestrel (MIRENA, 52 MG,) 20 MCG/DAY IUD by Intrauterine route. 06/07/20  Yes [provider]    Family History Family History  Problem Relation Age of Onset   Healthy Mother    Healthy Father    Healthy Brother    Atrial fibrillation Maternal Grandmother    Lung cancer Paternal Grandmother    Lung cancer Paternal Grandfather    Sudden death Brother    Breast cancer Other     Social History Social History   Tobacco Use   Smoking status: Never   Smokeless tobacco: Never  Vaping Use   Vaping Use: Never used  Substance Use Topics   Alcohol  use: Never    Comment: possibly 1 x per year   Drug use: No     Allergies   Septra [sulfamethoxazole-trimethoprim]   Review of Systems Review of Systems  Constitutional:  Positive for fatigue. Negative for appetite change, chills, diaphoresis and fever.  HENT:  Positive for congestion, ear pain, postnasal drip and rhinorrhea. Negative for ear discharge, sore throat and trouble swallowing.   Eyes:  Positive for discharge. Negative for pain and itching.  Respiratory:  Positive for cough.   Musculoskeletal:  Negative for myalgias.  Neurological:  Negative for headaches.    Physical Exam Triage Vital Signs ED Triage Vitals  Enc Vitals Group     BP 04/21/21 0955 134/83     Pulse Rate 04/21/21 0955 70     Resp 04/21/21 0955 14     Temp 04/21/21 0955 98.4 F (36.9 C)     Temp Source 04/21/21 0955 Oral     SpO2 04/21/21 0955 98 %     Weight  04/21/21 0954 215 lb (97.5 kg)     Height 04/21/21 0954 5\' 5"  (1.651 m)     Head Circumference --      Peak Flow --      Pain Score 04/21/21 0954 3     Pain Loc --      Pain Edu? --      Excl. in GC? --    No data found.  Updated Vital Signs BP 134/83 (BP Location: Right Arm)    Pulse 70    Temp 98.4 F (36.9 C) (Oral)    Resp 14    Ht 5\' 5"  (1.651 m)    Wt 215 lb (97.5 kg)    SpO2 98%    BMI 35.78 kg/m   Visual Acuity Right Eye Distance:   Left Eye Distance:   Bilateral Distance:    Right Eye Near:   Left Eye Near:    Bilateral Near:      Physical Exam Vitals signs and nursing note reviewed.  Constitutional:      General: She is not in acute distress.    Appearance: Normal appearance. She is not ill-appearing, toxic-appearing or diaphoretic.  HENT: Her R eye is slightly injected, but no active drainage noted.     Head: Normocephalic.     Right Ear: Tympanic membrane is red and bulging, but ear canal and external ear normal.     Left Ear: Tympanic membrane is pink and flat, but ear canal and external ear normal.     Nose: moderate congestion with clear mucous    Mouth/Throat: clear    Mouth: Mucous membranes are moist.  Eyes:     General: No scleral icterus.       Right eye: No discharge.        Left eye: No discharge.     Conjunctiva/sclera: Conjunctivae normal.  Neck:     Musculoskeletal: Neck supple. No neck rigidity.  Cardiovascular:     Rate and Rhythm: Normal rate and regular rhythm.     Heart sounds: No murmur.  Pulmonary:     Effort: Pulmonary effort is normal.     Breath sounds: Normal breath sounds.   Musculoskeletal: Normal range of motion.  Lymphadenopathy:     Cervical: No cervical adenopathy.  Skin:    General: Skin is warm and dry.     Coloration: Skin is not jaundiced.     Findings: No rash.  Neurological:     Mental Status: She is  alert and oriented to person, place, and time.     Gait: Gait normal.  Psychiatric:        Mood and Affect:  Mood normal.        Behavior: Behavior normal.        Thought Content: Thought content normal.        Judgment: Judgment normal.    UC Treatments / Results  Labs (all labs ordered are listed, but only abnormal results are displayed) Labs Reviewed - No data to display  EKG   Radiology No results found.  Procedures Procedures (including critical care time)  Medications Ordered in UC Medications - No data to display  Initial Impression / Assessment and Plan / UC Course  I have reviewed the triage vital signs and the nursing notes. URI BOM I placed her on Augmentin, Flonase and Rob AC as noted.  Final Clinical Impressions(s) / UC Diagnoses   Final diagnoses:  Non-recurrent acute serous otitis media, unspecified laterality  Upper respiratory tract infection, unspecified type   Discharge Instructions   None    ED Prescriptions     Medication Sig Dispense Auth. Provider   amoxicillin-clavulanate (AUGMENTIN) 875-125 MG tablet Take 1 tablet by mouth 2 (two) times daily. 14 tablet Rodriguez-Southworth, Kemiyah Tarazon, PA-C   fluticasone (FLONASE) 50 MCG/ACT nasal spray Place 2 sprays into both nostrils daily for 7 days. 16 g Rodriguez-Southworth, Rocket Gunderson, PA-C   guaiFENesin-codeine 100-10 MG/5ML syrup Take 5 mLs by mouth at bedtime. 118 mL Rodriguez-Southworth, Nettie Elm, PA-C      I have reviewed the PDMP during this encounter.   Garey Ham, PA-C 04/21/21 1020

## 2021-04-26 ENCOUNTER — Telehealth: Payer: Self-pay | Admitting: Internal Medicine

## 2021-04-26 NOTE — Telephone Encounter (Incomplete Revision)
Pt has a fever blister that is causing her lip to swell up.  She has enough valACYclovir (VALTREX) 1000 MG tablet °To lat about 3 days.  Made pt appt for Friday since it has been so long since this was precribed. ° °

## 2021-04-26 NOTE — Telephone Encounter (Addendum)
Pt has a fever blister that is causing her lip to swell up.  She has enough valACYclovir (VALTREX) 1000 MG tablet To lat about 3 days.  Made pt appt for Friday since it has been so long since this was precribed.

## 2021-04-27 ENCOUNTER — Telehealth (INDEPENDENT_AMBULATORY_CARE_PROVIDER_SITE_OTHER): Payer: No Typology Code available for payment source | Admitting: Family Medicine

## 2021-04-27 ENCOUNTER — Encounter: Payer: Self-pay | Admitting: Family Medicine

## 2021-04-27 VITALS — Wt 215.0 lb

## 2021-04-27 DIAGNOSIS — B001 Herpesviral vesicular dermatitis: Secondary | ICD-10-CM

## 2021-04-27 DIAGNOSIS — B379 Candidiasis, unspecified: Secondary | ICD-10-CM | POA: Diagnosis not present

## 2021-04-27 MED ORDER — FLUCONAZOLE 150 MG PO TABS: ORAL_TABLET | ORAL | 1 refills | Status: DC

## 2021-04-27 MED ORDER — VALACYCLOVIR HCL 1 G PO TABS: 1000.0000 mg | ORAL_TABLET | Freq: Three times a day (TID) | ORAL | 2 refills | Status: DC

## 2021-04-27 MED ORDER — VALACYCLOVIR HCL 1 G PO TABS: 1000.0000 mg | ORAL_TABLET | Freq: Three times a day (TID) | ORAL | 2 refills | Status: AC

## 2021-04-27 NOTE — Progress Notes (Signed)
Subjective:    Patient ID: [name], female    DOB: 04/11/1990, 31 y.o.   MRN: [id number]  Kimberly Lloyd is a 31 y.o. female presenting on 04/27/2021 for Oral Swelling  Virtual / Telehealth Encounter - Video Visit via MyChart The purpose of this virtual visit is to provide medical care while limiting exposure to the novel coronavirus (COVID19) for both patient and office staff.  Consent was obtained for remote visit:  Yes.   Answered questions that patient had about telehealth interaction:  Yes.   I discussed the limitations, risks, security and privacy concerns of performing an evaluation and management service by video/telephone. I also discussed with the patient that there may be a patient responsible charge related to this service. The patient expressed understanding and agreed to proceed.  Patient Location: Home Provider Location: [facility] (Office)  Participants in virtual visit: - Patient: Kimberly Lloyd - CMA: Burnell Blanks, CMA - Provider: Dr Althea Charon  PCP Nicki Reaper, FNP   HPI  Oral Cold Sore, Recurrent Usually 1 cold sore per year, she takes Valtrex , she has symptoms of lip tingling and swelling now new onset, recent start, only had 2-3 days of medicine left needs new re order Previously had Rx Valtrex 1000mg  TID for up to 10 days PRN flare only Denies fever chills  Yeast Infection due to antibiotics She had recent double ear infection and got antibiotics, she now has vaginal yeast infection, 1st time. Asks about treatment. Admits itching   Depression screen [facility] 2/9 07/24/2020 09/30/2019 04/29/2019  Decreased Interest 0 1 0  Down, Depressed, Hopeless 0 2 0  PHQ - 2 Score 0 3 0  Altered sleeping 0 0 -  Tired, decreased energy 0 1 -  Change in appetite 0 1 -  Feeling bad or failure about yourself  0 2 -  Trouble concentrating 0 0 -  Moving slowly or fidgety/restless 0 2 -  Suicidal thoughts 0 0 -  PHQ-9 Score 0 9 -  Difficult  doing work/chores Not difficult at all Somewhat difficult -    Social History   Tobacco Use   Smoking status: Never   Smokeless tobacco: Never  Vaping Use   Vaping Use: Never used  Substance Use Topics   Alcohol use: Never    Comment: possibly 1 x per year   Drug use: No    Review of Systems Per HPI unless specifically indicated above     Objective:    Wt 215 lb (97.5 kg)    BMI 35.78 kg/m   Wt Readings from Last 3 Encounters:  04/27/21 215 lb (97.5 kg)  04/21/21 215 lb (97.5 kg)  03/10/21 210 lb 15.7 oz (95.7 kg)    Physical Exam  Note examination was completely remotely via video observation objective data only  Gen - well-appearing, no acute distress or apparent pain, comfortable HEENT - eyes appear clear without discharge or redness Heart/Lungs - cannot examine virtually - observed no evidence of coughing or labored breathing. Abd - cannot examine virtually  Skin - R upper lip localized swelling cold sore Neuro - awake, alert, oriented Psych - not anxious appearing   Results for orders placed or performed in visit on 11/27/20  Urine Culture   Specimen: Urine  Result Value Ref Range   MICRO NUMBER: [id number]    SPECIMEN QUALITY: Adequate    Sample Source URINE, CLEAN CATCH    STATUS: FINAL    ISOLATE 1:  Escherichia coli (A)       Susceptibility   Escherichia coli - URINE CULTURE, REFLEX    AMOX/CLAVULANIC 4 Sensitive     AMPICILLIN 8 Sensitive     AMPICILLIN/SULBACTAM 4 Sensitive     CEFAZOLIN* <=4 Not Reportable      * For infections other than uncomplicated UTI caused by E. coli, K. pneumoniae or P. mirabilis: Cefazolin is resistant if MIC > or = 8 mcg/mL. (Distinguishing susceptible versus intermediate for isolates with MIC < or = 4 mcg/mL requires additional testing.) For uncomplicated UTI caused by E. coli, K. pneumoniae or P. mirabilis: Cefazolin is susceptible if MIC <32 mcg/mL and predicts susceptible to the oral agents cefaclor,  cefdinir, cefpodoxime, cefprozil, cefuroxime, cephalexin and loracarbef.     CEFTAZIDIME <=1 Sensitive     CEFEPIME <=1 Sensitive     CEFTRIAXONE <=1 Sensitive     CIPROFLOXACIN <=0.25 Sensitive     LEVOFLOXACIN <=0.12 Sensitive     GENTAMICIN <=1 Sensitive     IMIPENEM <=0.25 Sensitive     NITROFURANTOIN 32 Sensitive     PIP/TAZO <=4 Sensitive     TOBRAMYCIN <=1 Sensitive     TRIMETH/SULFA* <=20 Sensitive      * For infections other than uncomplicated UTI caused by E. coli, K. pneumoniae or P. mirabilis: Cefazolin is resistant if MIC > or = 8 mcg/mL. (Distinguishing susceptible versus intermediate for isolates with MIC < or = 4 mcg/mL requires additional testing.) For uncomplicated UTI caused by E. coli, K. pneumoniae or P. mirabilis: Cefazolin is susceptible if MIC <32 mcg/mL and predicts susceptible to the oral agents cefaclor, cefdinir, cefpodoxime, cefprozil, cefuroxime, cephalexin and loracarbef. Legend: S = Susceptible  I = Intermediate R = Resistant  NS = Not susceptible * = Not tested  NR = Not reported **NN = See antimicrobic comments   POCT Urinalysis Dipstick  Result Value Ref Range   Color, UA dark yellow    Clarity, UA cloudy    Glucose, UA Negative Negative   Bilirubin, UA negative    Ketones, UA negative    Spec Grav, UA 1.015 1.010 - 1.025   Blood, UA large    pH, UA 6.0 5.0 - 8.0   Protein, UA Negative Negative   Urobilinogen, UA 0.2 0.2 or 1.0 E.U./dL   Nitrite, UA negative    Leukocytes, UA Large (3+) (A) Negative   Appearance     Odor        Assessment & Plan:   Problem List Items Addressed This Visit   None Visit Diagnoses     Recurrent cold sores    -  Primary   Relevant Medications   fluconazole (DIFLUCAN) 150 MG tablet   valACYclovir (VALTREX) 1000 MG tablet   Antibiotic-induced yeast infection       Relevant Medications   fluconazole (DIFLUCAN) 150 MG tablet   valACYclovir (VALTREX) 1000 MG tablet       Oral/labial Lip  recurrent cold sore Acute flare today 2-3 days Previous success on valtrex TID 10 day WIll re order Valtrex 1000mg  TID for up to 10 days, counseling in future can consider BID dosing if preferred vs prophylaxis however she only gets 1 flare per year, would keep on PRN dosing for now. Added refills for future flares.  Yeast infection, secondary to antibiotics Order Diflucan   Meds ordered this encounter  Medications   DISCONTD: valACYclovir (VALTREX) 1000 MG tablet    Sig: Take 1 tablet (1,000 mg total) by mouth  3 (three) times daily.    Dispense:  30 tablet    Refill:  2   fluconazole (DIFLUCAN) 150 MG tablet    Sig: Take one tablet by mouth on Day 1. Repeat dose 2nd tablet on Day 3.    Dispense:  2 tablet    Refill:  1   valACYclovir (VALTREX) 1000 MG tablet    Sig: Take 1 tablet (1,000 mg total) by mouth 3 (three) times daily. As needed only for cold sore flare for up to 10 day    Dispense:  30 tablet    Refill:  2    Edited rx dosing instructions to PRN flare only      Follow up plan: Return if symptoms worsen or fail to improve.  Patient verbalizes understanding with the above medical recommendations including the limitation of remote medical advice.  Specific follow-up and call-back criteria were given for patient to follow-up or seek medical care more urgently if needed.  Total duration of direct patient care provided via video conference: 8 minutes   Saralyn Pilar, DO [facility] 04/27/2021, 11:31 AM

## 2021-04-27 NOTE — Patient Instructions (Addendum)
Take Valtrex for flare cold sore 3 times a day for 10 days, refills added  Take Diflucan for yeast, day 1 then skip then day 3 then may refill if need in future  Please schedule a Follow-up Appointment to: Return if symptoms worsen or fail to improve.  If you have any other questions or concerns, please feel free to call the office or send a message through MyChart. You may also schedule an earlier appointment if necessary.  Additionally, you may be receiving a survey about your experience at our office within a few days to 1 week by e-mail or mail. We value your feedback.  Saralyn Pilar, DO [facility], [place]

## 2021-05-01 NOTE — Telephone Encounter (Signed)
Will address at upcoming appointment

## 2021-05-07 ENCOUNTER — Ambulatory Visit (INDEPENDENT_AMBULATORY_CARE_PROVIDER_SITE_OTHER): Payer: No Typology Code available for payment source | Admitting: Internal Medicine

## 2021-05-07 ENCOUNTER — Other Ambulatory Visit: Payer: Self-pay

## 2021-05-07 ENCOUNTER — Encounter: Payer: Self-pay | Admitting: Internal Medicine

## 2021-05-07 VITALS — BP 109/77 | HR 85 | Temp 97.7°F | Ht 65.0 in | Wt 219.0 lb

## 2021-05-07 DIAGNOSIS — Z0001 Encounter for general adult medical examination with abnormal findings: Secondary | ICD-10-CM

## 2021-05-07 DIAGNOSIS — Z6836 Body mass index (BMI) 36.0-36.9, adult: Secondary | ICD-10-CM

## 2021-05-07 DIAGNOSIS — Z1159 Encounter for screening for other viral diseases: Secondary | ICD-10-CM | POA: Diagnosis not present

## 2021-05-07 DIAGNOSIS — E66812 Obesity, class 2: Secondary | ICD-10-CM

## 2021-05-07 DIAGNOSIS — Z111 Encounter for screening for respiratory tuberculosis: Secondary | ICD-10-CM

## 2021-05-07 DIAGNOSIS — Z114 Encounter for screening for human immunodeficiency virus [HIV]: Secondary | ICD-10-CM

## 2021-05-07 NOTE — Progress Notes (Signed)
? ?Subjective:  ? ? Patient ID: Kimberly Lloyd, female    DOB: 07-11-1990, 31 y.o.   MRN: [id number] ? ?HPI ? ?Patient presents to clinic today for her annual exam. ? ?Flu: 11/2020 ?Tetanus: 12/2019 ?COVID: Pfizer x 2 ?Pap smear: 06/2020 ?Dentist: biannually ? ?Diet: She does eat meat. She consumes fruits and veggies. She does eat some fried foods. She drinks mostly flavored water. ?Exercise: None ? ?Review of Systems ? ?   ?Past Medical History:  ?Diagnosis Date  ? Complication of anesthesia   ? GETS AGGRESSIVE   ? Endometriosis   ? Idiopathic intracranial hypertension 04/2018  ? Wisdom teeth extracted   ? ? ?Current Outpatient Medications  ?Medication Sig Dispense Refill  ? amoxicillin-clavulanate (AUGMENTIN) 875-125 MG tablet Take 1 tablet by mouth 2 (two) times daily. 14 tablet 0  ? fluconazole (DIFLUCAN) 150 MG tablet Take one tablet by mouth on Day 1. Repeat dose 2nd tablet on Day 3. 2 tablet 1  ? fluticasone (FLONASE) 50 MCG/ACT nasal spray Place 2 sprays into both nostrils daily for 7 days. 16 g 0  ? guaiFENesin-codeine 100-10 MG/5ML syrup Take 5 mLs by mouth at bedtime. 118 mL 0  ? levonorgestrel (MIRENA, 52 MG,) 20 MCG/DAY IUD by Intrauterine route.    ? valACYclovir (VALTREX) 1000 MG tablet Take 1 tablet (1,000 mg total) by mouth 3 (three) times daily. As needed only for cold sore flare for up to 10 day 30 tablet 2  ? ?No current facility-administered medications for this visit.  ? ? ?Allergies  ?Allergen Reactions  ? Septra [Sulfamethoxazole-Trimethoprim] Rash  ? ? ?Family History  ?Problem Relation Age of Onset  ? Healthy Mother   ? Healthy Father   ? Healthy Brother   ? Atrial fibrillation Maternal Grandmother   ? Lung cancer Paternal Grandmother   ? Lung cancer Paternal Grandfather   ? Sudden death Brother   ? Breast cancer Other   ? ? ?Social History  ? ?Socioeconomic History  ? Marital status: Single  ?  Spouse name: Not on file  ? Number of children: Not on file  ? Years of education: Not on file  ?  Highest education level: Bachelor's degree (e.g., BA, AB, BS)  ?Occupational History  ? Occupation: Equities trader  ?Tobacco Use  ? Smoking status: Never  ? Smokeless tobacco: Never  ?Vaping Use  ? Vaping Use: Never used  ?Substance and Sexual Activity  ? Alcohol use: Never  ?  Comment: possibly 1 x per year  ? Drug use: No  ? Sexual activity: Yes  ?  Birth control/protection: I.U.D.  ?  Comment: Mirena  ?Other Topics Concern  ? Not on file  ?Social History Narrative  ? Not on file  ? ?Social Determinants of Health  ? ?Financial Resource Strain: Not on file  ?Food Insecurity: Not on file  ?Transportation Needs: Not on file  ?Physical Activity: Not on file  ?Stress: Not on file  ?Social Connections: Not on file  ?Intimate Partner Violence: Not on file  ? ? ? ?Constitutional: Patient reports intermittent headaches.  Denies fever, malaise, fatigue, or abrupt weight changes.  ?HEENT: Denies eye pain, eye redness, ear pain, ringing in the ears, wax buildup, runny nose, nasal congestion, bloody nose, or sore throat. ?Respiratory: Denies difficulty breathing, shortness of breath, cough or sputum production.   ?Cardiovascular: Denies chest pain, chest tightness, palpitations or swelling in the hands or feet.  ?Gastrointestinal: Denies abdominal pain, bloating, constipation, diarrhea or blood  in the stool.  ?GU: Denies urgency, frequency, pain with urination, burning sensation, blood in urine, odor or discharge. ?Musculoskeletal: Denies decrease in range of motion, difficulty with gait, muscle pain or joint pain and swelling.  ?Skin: Denies redness, rashes, lesions or ulcercations.  ?Neurological: Denies dizziness, difficulty with memory, difficulty with speech or problems with balance and coordination.  ?Psych: Patient has a history of anxiety.  Denies depression, SI/HI. ? ?No other specific complaints in a complete review of systems (except as listed in HPI above). ? ?Objective:  ? Physical Exam ? ? ?BP 109/77 (BP  Location: Left Arm, Patient Position: Sitting, Cuff Size: Large)   Pulse 85   Temp 97.7 ?F (36.5 ?C) (Temporal)   Ht 5' 5" (1.651 m)   Wt 219 lb (99.3 kg)   SpO2 100%   BMI 36.44 kg/m?  ? ?Wt Readings from Last 3 Encounters:  ?04/27/21 215 lb (97.5 kg)  ?04/21/21 215 lb (97.5 kg)  ?03/10/21 210 lb 15.7 oz (95.7 kg)  ? ? ?General: Appears her stated age, obese, in NAD. ?Skin: Warm, dry and intact.  ?HEENT: Head: normal shape and size; Eyes: sclera white and EOMs intact;  ?Neck:  Neck supple, trachea midline. No masses, lumps or thyromegaly present.  ?Cardiovascular: Normal rate and rhythm. S1,S2 noted.  No murmur, rubs or gallops noted. No JVD or BLE edema.  ?Pulmonary/Chest: Normal effort and positive vesicular breath sounds. No respiratory distress. No wheezes, rales or ronchi noted.  ?Abdomen: Soft and nontender.  ?Musculoskeletal: Strength 5/5 BUE/BLE. No difficulty with gait.  ?Neurological: Alert and oriented. Cranial nerves II-XII grossly intact. Coordination normal.  ?Psychiatric: Mood and affect normal. Behavior is normal. Judgment and thought content normal.  ? ?BMET ?   ?Component Value Date/Time  ? NA 136 04/04/2018 1014  ? K 3.6 04/04/2018 1014  ? CL 107 04/04/2018 1014  ? CO2 21 (L) 04/04/2018 1014  ? GLUCOSE 167 (H) 04/04/2018 1014  ? BUN 8 04/04/2018 1014  ? CREATININE 0.70 04/04/2018 1014  ? CALCIUM 8.2 (L) 04/04/2018 1014  ? GFRNONAA >60 04/04/2018 1014  ? GFRAA >60 04/04/2018 1014  ? ? ?Lipid Panel  ?No results found for: CHOL, TRIG, HDL, CHOLHDL, VLDL, LDLCALC ? ?CBC ?   ?Component Value Date/Time  ? WBC 8.2 06/24/2019 0811  ? RBC 4.24 06/24/2019 0811  ? HGB 12.6 06/24/2019 0811  ? HGB 10.9 (L) 02/25/2012 0258  ? HCT 37.7 06/24/2019 0811  ? HCT 32.0 (L) 02/26/2012 0539  ? PLT 345 06/24/2019 0811  ? PLT 242 02/25/2012 0258  ? MCV 88.9 06/24/2019 0811  ? MCV 91 02/25/2012 0258  ? MCH 29.7 06/24/2019 0811  ? MCHC 33.4 06/24/2019 0811  ? RDW 12.2 06/24/2019 0811  ? RDW 12.8 02/25/2012 0258  ?  LYMPHSABS 1.1 04/04/2018 1014  ? LYMPHSABS 1.9 02/25/2012 0258  ? MONOABS 0.4 04/04/2018 1014  ? MONOABS 1.3 (H) 02/25/2012 0258  ? EOSABS 0.1 04/04/2018 1014  ? EOSABS 0.1 02/25/2012 0258  ? BASOSABS 0.0 04/04/2018 1014  ? BASOSABS 0.1 02/25/2012 0258  ? ? ?Hgb A1C ?No results found for: HGBA1C ? ? ? ? ?   ?Assessment & Plan:  ? ?Preventative Health Maintenance: ? ?Flu shot UTD ?Tetanus UTD ?Encouraged her to get her COVID-booster ?Pap smear UTD ?Encouraged her to consume a balanced diet and exercise regimen ?Advised her to see a dentist annually ?We will check CBC, c-Met, lipid, A1c, HIV and hep C today ? ?RTC in 6 months, follow-up  chronic conditions ? ?Webb Silversmith, NP ?This visit occurred during the SARS-CoV-2 public health emergency.  Safety protocols were in place, including screening questions prior to the visit, additional usage of staff PPE, and extensive cleaning of exam room while observing appropriate contact time as indicated for disinfecting solutions.  ? ?

## 2021-05-07 NOTE — Patient Instructions (Signed)

## 2021-05-07 NOTE — Assessment & Plan Note (Signed)
Encouraged diet and exercise for weight loss ?

## 2021-05-11 LAB — HEMOGLOBIN A1C
Hgb A1c MFr Bld: 5.6 % of total Hgb (ref <5.7)
Mean Plasma Glucose: 114 mg/dL
eAG (mmol/L): 6.3 mmol/L

## 2021-05-11 LAB — COMPLETE METABOLIC PANEL WITH GFR
AG Ratio: 1.6 (calc) (ref 1.0–2.5)
ALT: 15 U/L (ref 6–29)
AST: 15 U/L (ref 10–30)
Albumin: 4.4 g/dL (ref 3.6–5.1)
Alkaline phosphatase (APISO): 71 U/L (ref 31–125)
BUN: 12 mg/dL (ref 7–25)
CO2: 23 mmol/L (ref 20–32)
Calcium: 9.2 mg/dL (ref 8.6–10.2)
Chloride: 105 mmol/L (ref 98–110)
Creat: 0.73 mg/dL (ref 0.50–0.97)
Globulin: 2.7 g/dL (calc) (ref 1.9–3.7)
Glucose, Bld: 111 mg/dL (ref 65–139)
Potassium: 4.3 mmol/L (ref 3.5–5.3)
Sodium: 140 mmol/L (ref 135–146)
Total Bilirubin: 0.2 mg/dL (ref 0.2–1.2)
Total Protein: 7.1 g/dL (ref 6.1–8.1)
eGFR: 113 mL/min/{1.73_m2} (ref >=60)

## 2021-05-11 LAB — CBC
HCT: 37.6 % (ref 35.0–45.0)
Hemoglobin: 12.4 g/dL (ref 11.7–15.5)
MCH: 29.6 pg (ref 27.0–33.0)
MCHC: 33 g/dL (ref 32.0–36.0)
MCV: 89.7 fL (ref 80.0–100.0)
MPV: 9.1 fL (ref 7.5–12.5)
Platelets: 397 10*3/uL (ref 140–400)
RBC: 4.19 10*6/uL (ref 3.80–5.10)
RDW: 12.7 % (ref 11.0–15.0)
WBC: 8.7 10*3/uL (ref 3.8–10.8)

## 2021-05-11 LAB — LIPID PANEL
Cholesterol: 160 mg/dL (ref <200)
HDL: 35 mg/dL — ABNORMAL LOW (ref >=50)
LDL Cholesterol (Calc): 101 mg/dL (calc) — ABNORMAL HIGH
Non-HDL Cholesterol (Calc): 125 mg/dL (calc) (ref <130)
Total CHOL/HDL Ratio: 4.6 (calc) (ref <5.0)
Triglycerides: 142 mg/dL (ref <150)

## 2021-05-11 LAB — HEPATITIS C ANTIBODY
Hepatitis C Ab: NONREACTIVE
SIGNAL TO CUT-OFF: 0.05 (ref <1.00)

## 2021-05-11 LAB — QUANTIFERON-TB GOLD PLUS
Mitogen-NIL: >10 IU/mL
NIL: 0.03 IU/mL
QuantiFERON-TB Gold Plus: NEGATIVE
TB1-NIL: 0.02 IU/mL
TB2-NIL: 0.01 IU/mL

## 2021-05-11 LAB — HIV ANTIBODY (ROUTINE TESTING W REFLEX): HIV 1&2 Ab, 4th Generation: NONREACTIVE

## 2021-08-18 DIAGNOSIS — N870 Mild cervical dysplasia: Secondary | ICD-10-CM | POA: Insufficient documentation

## 2021-08-18 NOTE — Progress Notes (Unsigned)
PCP:  Lorre Munroe, NP   No chief complaint on file.    HPI:      [name] is a 31 y.o. G1W2993 whose LMP was No LMP recorded. (Menstrual status: IUD)., presents today for her annual examination.  Her menses are {norm/abn:715}, lasting {number: 22536} days.  Dysmenorrhea {dysmen:716}. She {does:18564} have intermenstrual bleeding.  Sex activity: {sex active: 315163}. 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 Jean Rosenthal;  Hx of STDs: {STD hx:14358}  Last mammogram: {date:304500300}  Results were: {norm/abn:13465} There is no FH of breast cancer. There is no FH of ovarian cancer. The patient {does:18564} do self-breast exams.  Tobacco use: {tob:20664} Alcohol use: {Alcohol:11675} No drug use.  Exercise: {exercise:31265}  She {does:18564} get adequate calcium and Vitamin D in her diet.  Patient Active Problem List   Diagnosis Date Noted   Class 2 obesity due to excess calories with body mass index (BMI) of 36.0 to 36.9 in adult 11/27/2020   Anxiety 09/30/2019   Herpes simplex 06/09/2019   Headache disorder 05/12/2018   Pseudotumor cerebri 04/21/2018    Past Surgical History:  Procedure Laterality Date   CYST EXCISION     DIAGNOSTIC LAPAROSCOPY     LUMBAR PUNCTURE  04/2018   x3   ORIF ANKLE FRACTURE Left 09/07/2015   Procedure: OPEN REDUCTION INTERNAL FIXATION (ORIF) ANKLE FRACTURE;  Surgeon: Kennedy Bucker, MD;  Location: [facility] ORS;  Service: Orthopedics;  Laterality: Left;   TONSILLECTOMY     TONSILLECTOMY AND ADENOIDECTOMY      Family History  Problem Relation Age of Onset   Healthy Mother    Healthy Father    Healthy Brother    Atrial fibrillation Maternal Grandmother    Lung cancer Paternal Grandmother    Lung cancer Paternal Grandfather    Sudden death Brother    Breast  cancer Other     Social History   Socioeconomic History   Marital status: Single    Spouse name: Not on file   Number of children: Not on file   Years of education: Not on file   Highest education level: Bachelor's degree (e.g., BA, AB, BS)  Occupational History   Occupation: Designer, jewellery  Tobacco Use   Smoking status: Never   Smokeless tobacco: Never  Vaping Use   Vaping Use: Never used  Substance and Sexual Activity   Alcohol use: Never    Comment: possibly 1 x per year   Drug use: No   Sexual activity: Yes    Birth control/protection: I.U.D.    Comment: Mirena  Other Topics Concern   Not on file  Social History Narrative   Not on file   Social Determinants of Health   Financial Resource Strain: Low Risk  (10/24/2017)   Overall Financial Resource Strain (CARDIA)    Difficulty of Paying Living Expenses: Not hard at all  Food Insecurity: No Food Insecurity (10/24/2017)   Hunger Vital Sign    Worried About Running Out of Food in the Last Year: Never true    Ran Out of Food in the Last Year: Never true  Transportation Needs: No Transportation Needs (10/24/2017)   PRAPARE - Administrator, Civil Service (Medical): No    Lack of Transportation (Non-Medical): No  Physical Activity: Sufficiently Active (01/16/2017)   Exercise Vital  Sign    Days of Exercise per Week: 4 days    Minutes of Exercise per Session: 60 min  Stress: No Stress Concern Present (01/16/2017)   [employer] of Occupational Health - Occupational Stress Questionnaire    Feeling of Stress : Not at all  Social Connections: Moderately Integrated (01/16/2017)   Social Connection and Isolation Panel [NHANES]    Frequency of Communication with Friends and Family: More than three times a week    Frequency of Social Gatherings with Friends and Family: Once a week    Attends Religious Services: More than 4 times per year    Active Member of [employer] or Organizations: Yes    Attends Museum/gallery exhibitions officer: More than 4 times per year    Marital Status: Divorced  Intimate Partner Violence: Not At Risk (01/16/2017)   Humiliation, Afraid, Rape, and Kick questionnaire    Fear of Current or Ex-Partner: No    Emotionally Abused: No    Physically Abused: No    Sexually Abused: No     Current Outpatient Medications:    fluticasone (FLONASE) 50 MCG/ACT nasal spray, Place 2 sprays into both nostrils daily for 7 days., Disp: 16 g, Rfl: 0   levonorgestrel (MIRENA, 52 MG,) 20 MCG/DAY IUD, by Intrauterine route., Disp: , Rfl:    valACYclovir (VALTREX) 1000 MG tablet, Take 1 tablet (1,000 mg total) by mouth 3 (three) times daily. As needed only for cold sore flare for up to 10 day, Disp: 30 tablet, Rfl: 2     ROS:  Review of Systems BREAST: No symptoms   Objective: There were no vitals taken for this visit.   OBGyn Exam  Results: No results found for this or any previous visit (from the past 24 hour(s)).  Assessment/Plan: No diagnosis found.  No orders of the defined types were placed in this encounter.            GYN counsel {counseling: 16159}     F/U  No follow-ups on file.  Laymon Stockert B. Feliciano Wynter, PA-C 08/18/2021 12:25 PM

## 2021-08-21 ENCOUNTER — Ambulatory Visit (INDEPENDENT_AMBULATORY_CARE_PROVIDER_SITE_OTHER): Payer: No Typology Code available for payment source | Admitting: Obstetrics and Gynecology

## 2021-08-21 ENCOUNTER — Encounter: Payer: Self-pay | Admitting: Obstetrics and Gynecology

## 2021-08-21 ENCOUNTER — Other Ambulatory Visit (HOSPITAL_COMMUNITY)
Admission: RE | Admit: 2021-08-21 | Discharge: 2021-08-21 | Disposition: A | Discharge status: Home / Self Care | Payer: No Typology Code available for payment source | Source: Ambulatory Visit | Attending: Obstetrics and Gynecology | Admitting: Obstetrics and Gynecology

## 2021-08-21 VITALS — BP 110/80 | Ht 65.0 in | Wt 221.0 lb

## 2021-08-21 DIAGNOSIS — Z30431 Encounter for routine checking of intrauterine contraceptive device: Secondary | ICD-10-CM

## 2021-08-21 DIAGNOSIS — Z1151 Encounter for screening for human papillomavirus (HPV): Secondary | ICD-10-CM | POA: Insufficient documentation

## 2021-08-21 DIAGNOSIS — N870 Mild cervical dysplasia: Secondary | ICD-10-CM

## 2021-08-21 DIAGNOSIS — Z01419 Encounter for gynecological examination (general) (routine) without abnormal findings: Secondary | ICD-10-CM | POA: Diagnosis not present

## 2021-08-21 DIAGNOSIS — Z124 Encounter for screening for malignant neoplasm of cervix: Secondary | ICD-10-CM

## 2021-08-21 DIAGNOSIS — Z113 Encounter for screening for infections with a predominantly sexual mode of transmission: Secondary | ICD-10-CM

## 2021-08-21 NOTE — Patient Instructions (Signed)
I value your feedback and you entrusting us with your care. If you get a [place] patient survey, I would appreciate you taking the time to let us know about your experience today. Thank you! ? ? ?

## 2021-08-23 LAB — CERVICOVAGINAL ANCILLARY ONLY
Chlamydia: NEGATIVE
Comment: NEGATIVE
Comment: NORMAL
Neisseria Gonorrhea: NEGATIVE

## 2021-08-25 LAB — CYTOLOGY - PAP
Comment: NEGATIVE
Diagnosis: NEGATIVE
Diagnosis: REACTIVE
High risk HPV: NEGATIVE

## 2022-06-17 ENCOUNTER — Ambulatory Visit: Payer: 59 | Attending: Internal Medicine | Admitting: Physical Therapy

## 2022-06-17 ENCOUNTER — Encounter: Payer: Self-pay | Admitting: Physical Therapy

## 2022-06-17 DIAGNOSIS — M79662 Pain in left lower leg: Secondary | ICD-10-CM | POA: Insufficient documentation

## 2022-06-17 DIAGNOSIS — M79661 Pain in right lower leg: Secondary | ICD-10-CM | POA: Insufficient documentation

## 2022-06-17 NOTE — Therapy (Signed)
PT/OT/SLP Screening Form   Time: in___1535___     Time out__1600___   Complaint ______Shin pain_____________ Past Medical Hx:  ______L ankle surgery from severe inversion ankle sprain/ broken ankle __________ Injury Date:_________3 weeks approximately ( no specific injury) _______________________  Pain Scale: __ ___2/10 ___( Saturday during walk (5/10)____ Patient's contact information: [email or web address]   Hx (this occurrence):   Chief complaint is pain in her shins bilaterally Pt reports some pain in her shins. Pt points to pain on the outside of her shins.  Saturday walking pt walked 2 miles. Pt was walking on asphalt. (Pt has a 34 and 33 year old boys) no history of dietary changes and no history of significant increase in activity levels recently Immediate hx: pt has pins in her L ankle from a surgery, did not have any physical therapy following the surgery. Medical management: Pt has not had any known injury to the shins  Past history of similar issues N/A  General Health patient reports she is overweight but has no standing medical diagnoses at this time.   Assessment:  Pain with palpation to her bilateral lateral tibia area that reproduces pt pain. No pain with muscular palpation to ant compartment or calf musculature.    Weak inversion of the L ( 4/5) and weak eversion on the R ( 4/5) with pain with both and painful for end range DF but range WNL  From the screen it appears patient has symptoms consistent with shin splints bilaterally.  Patient instructed she is not at high risk for progression to tibial stress fractures based on subjective history but if her pain continues to persist despite interventions, rest, and modifications recommended by PT should seek advice from her physicians to ensure she does not have stress fracture or other more serious injury.  Recommendations:    Access Code: [email or web address] URL: [email or web address] Date:  06/17/2022 Prepared by: Thresa Ross  Exercises - Long Sitting Ankle Eversion with Resistance  - 1 x daily - 7 x weekly - 2 sets - 10 reps - Long Sitting Ankle Inversion with Resistance  - 1 x daily - 7 x weekly - 2 sets - 10 reps - Seated ankle alphabet with resistance   - 1 x daily - 7 x weekly - 2 sets Comments:    []  Patient would benefit from an MD referral []  Patient would benefit from a full PT/OT/ SLP evaluation and treatment. [x]  No intervention recommended at this time, recommended patient utilize some type of nonsteroidal anti-inflammatory over-the-counter medicine as long as she is able to tolerate this medicine is not contraindicated for other reasons.  Patient also instructed to attempt when walking to walk on softer surfaces and to avoid asphalt during longer walks.  Patient also instructed she can consider purchasing some type of compression sleeve for her calves to alleviate inflammation in this area during standing or walking.  Patient instructed to do this for about 2 weeks and if pain continues to seek referral to her position her primary care doctor for further instruction and guidance.

## 2022-09-13 ENCOUNTER — Encounter: Payer: Self-pay | Admitting: Internal Medicine

## 2022-09-13 ENCOUNTER — Encounter: Payer: 59 | Admitting: Internal Medicine

## 2022-09-13 NOTE — Progress Notes (Unsigned)
Subjective:    Patient ID: Kimberly Lloyd, female    DOB: Feb 19, 1991, 32 y.o.   MRN: [id number]  HPI  Pt presents to the clinic today for her annual exam. She is also due to follow up chronic conditions.  Frequent headaches/pseudotumor cerebrii: These occur. Triggered by. She takes as needed with good relief of symptoms. She does not follow with neurology.  Genital herpes: She denies recent outbreak. She takes valacyclovir as needed with good results.  Anxiety: Situational. She is not currently taking any medications for this. She is not seeing a therapist. She denies depression, SI/HI.  HLD: Her last LDL was 101, triglycerides 142, 05/2021.  She is not taking any cholesterol-lowering medication at this time.  She does not consume low-fat diet.  Flu: 11/2020 Tetanus: 12/2019 Covid: never Pap smear: 08/2021 Dentist:  Diet: Exercise:  Review of Systems     Past Medical History:  Diagnosis Date   Complication of anesthesia    GETS AGGRESSIVE    Endometriosis    Idiopathic intracranial hypertension 04/2018   Wisdom teeth extracted     Current Outpatient Medications  Medication Sig Dispense Refill   fluticasone (FLONASE) 50 MCG/ACT nasal spray Place 2 sprays into both nostrils daily for 7 days. 16 g 0   levonorgestrel (MIRENA, 52 MG,) 20 MCG/DAY IUD by Intrauterine route.     valACYclovir (VALTREX) 1000 MG tablet Take 1 tablet (1,000 mg total) by mouth 3 (three) times daily. As needed only for cold sore flare for up to 10 day 30 tablet 2   No current facility-administered medications for this visit.    Allergies  Allergen Reactions   Septra [Sulfamethoxazole-Trimethoprim] Rash    Family History  Problem Relation Age of Onset   Healthy Mother    Healthy Father    Healthy Brother    Sudden death Brother    Atrial fibrillation Maternal Grandmother    Lung cancer Paternal Grandmother    Lung cancer Paternal Grandfather    Breast cancer Other        not sure of age     Social History   Socioeconomic History   Marital status: Single    Spouse name: Not on file   Number of children: Not on file   Years of education: Not on file   Highest education level: Bachelor's degree (e.g., BA, AB, BS)  Occupational History   Occupation: Designer, jewellery  Tobacco Use   Smoking status: Never   Smokeless tobacco: Never  Vaping Use   Vaping status: Never Used  Substance and Sexual Activity   Alcohol use: Never    Comment: possibly 1 x per year   Drug use: No   Sexual activity: Not Currently    Birth control/protection: I.U.D.    Comment: Mirena  Other Topics Concern   Not on file  Social History Narrative   Not on file   Social Determinants of Health   Financial Resource Strain: Low Risk  (10/24/2017)   Overall Financial Resource Strain (CARDIA)    Difficulty of Paying Living Expenses: Not hard at all  Food Insecurity: No Food Insecurity (10/24/2017)   Hunger Vital Sign    Worried About Running Out of Food in the Last Year: Never true    Ran Out of Food in the Last Year: Never true  Transportation Needs: No Transportation Needs (10/24/2017)   PRAPARE - Administrator, Civil Service (Medical): No    Lack of Transportation (Non-Medical): No  Physical  Activity: Sufficiently Active (01/16/2017)   Exercise Vital Sign    Days of Exercise per Week: 4 days    Minutes of Exercise per Session: 60 min  Stress: No Stress Concern Present (01/16/2017)   [employer] of Occupational Health - Occupational Stress Questionnaire    Feeling of Stress : Not at all  Social Connections: Moderately Integrated (01/16/2017)   Social Connection and Isolation Panel [NHANES]    Frequency of Communication with Friends and Family: More than three times a week    Frequency of Social Gatherings with Friends and Family: Once a week    Attends Religious Services: More than 4 times per year    Active Member of [employer] or Organizations: Yes    Attends Tax inspector Meetings: More than 4 times per year    Marital Status: Divorced  Intimate Partner Violence: Not At Risk (01/16/2017)   Humiliation, Afraid, Rape, and Kick questionnaire    Fear of Current or Ex-Partner: No    Emotionally Abused: No    Physically Abused: No    Sexually Abused: No     Constitutional: Pt reports intermittent headaches. Denies fever, malaise, fatigue, or abrupt weight changes.  HEENT: Denies eye pain, eye redness, ear pain, ringing in the ears, wax buildup, runny nose, nasal congestion, bloody nose, or sore throat. Respiratory: Denies difficulty breathing, shortness of breath, cough or sputum production.   Cardiovascular: Denies chest pain, chest tightness, palpitations or swelling in the hands or feet.  Gastrointestinal: Denies abdominal pain, bloating, constipation, diarrhea or blood in the stool.  GU: Denies urgency, frequency, pain with urination, burning sensation, blood in urine, odor or discharge. Musculoskeletal: Denies decrease in range of motion, difficulty with gait, muscle pain or joint pain and swelling.  Skin: Denies redness, rashes, lesions or ulcercations.  Neurological: Denies dizziness, difficulty with memory, difficulty with speech or problems with balance and coordination.  Psych: Pt has a history of anxiety. Denies depression, SI/HI.  No other specific complaints in a complete review of systems (except as listed in HPI above).  Objective:   Physical Exam   There were no vitals taken for this visit. Wt Readings from Last 3 Encounters:  08/21/21 221 lb (100.2 kg)  05/07/21 219 lb (99.3 kg)  04/27/21 215 lb (97.5 kg)    General: Appears their stated age, well developed, well nourished in NAD. Skin: Warm, dry and intact. No rashes, lesions or ulcerations noted. HEENT: Head: normal shape and size; Eyes: sclera white, no icterus, conjunctiva pink, PERRLA and EOMs intact; Ears: Tm's gray and intact, normal light reflex; Nose: mucosa pink  and moist, septum midline; Throat/Mouth: Teeth present, mucosa pink and moist, no exudate, lesions or ulcerations noted.  Neck:  Neck supple, trachea midline. No masses, lumps or thyromegaly present.  Cardiovascular: Normal rate and rhythm. S1,S2 noted.  No murmur, rubs or gallops noted. No JVD or BLE edema. No carotid bruits noted. Pulmonary/Chest: Normal effort and positive vesicular breath sounds. No respiratory distress. No wheezes, rales or ronchi noted.  Abdomen: Soft and nontender. Normal bowel sounds. No distention or masses noted. Liver, spleen and kidneys non palpable. Musculoskeletal: Normal range of motion. No signs of joint swelling. No difficulty with gait.  Neurological: Alert and oriented. Cranial nerves II-XII grossly intact. Coordination normal.  Psychiatric: Mood and affect normal. Behavior is normal. Judgment and thought content normal.    BMET    Component Value Date/Time   NA 140 05/07/2021 1344   K 4.3 05/07/2021 1344  CL 105 05/07/2021 1344   CO2 23 05/07/2021 1344   GLUCOSE 111 05/07/2021 1344   BUN 12 05/07/2021 1344   CREATININE 0.73 05/07/2021 1344   CALCIUM 9.2 05/07/2021 1344   GFRNONAA >60 04/04/2018 1014   GFRAA >60 04/04/2018 1014    Lipid Panel     Component Value Date/Time   CHOL 160 05/07/2021 1344   TRIG 142 05/07/2021 1344   HDL 35 (L) 05/07/2021 1344   CHOLHDL 4.6 05/07/2021 1344   LDLCALC 101 (H) 05/07/2021 1344    CBC    Component Value Date/Time   WBC 8.7 05/07/2021 1344   RBC 4.19 05/07/2021 1344   HGB 12.4 05/07/2021 1344   HGB 10.9 (L) 02/25/2012 0258   HCT 37.6 05/07/2021 1344   HCT 32.0 (L) 02/26/2012 0616   PLT 397 05/07/2021 1344   PLT 242 02/25/2012 0258   MCV 89.7 05/07/2021 1344   MCV 91 02/25/2012 0258   MCH 29.6 05/07/2021 1344   MCHC 33.0 05/07/2021 1344   RDW 12.7 05/07/2021 1344   RDW 12.8 02/25/2012 0258   LYMPHSABS 1.1 04/04/2018 1014   LYMPHSABS 1.9 02/25/2012 0258   MONOABS 0.4 04/04/2018 1014    MONOABS 1.3 (H) 02/25/2012 0258   EOSABS 0.1 04/04/2018 1014   EOSABS 0.1 02/25/2012 0258   BASOSABS 0.0 04/04/2018 1014   BASOSABS 0.1 02/25/2012 0258    Hgb A1C Lab Results  Component Value Date   HGBA1C 5.6 05/07/2021           Assessment & Plan:   Preventative health maintenance:  Encouraged her to get a flu shot in the fall Tetanus UTD Encouraged her to get her COVID-vaccine Pap smear UTD Encouraged her to consume a balanced diet and exercise regimen Advised her to see a dentist annually We will check CBC, c-Met, lipid, A1c today  RTC in 6 months, follow-up chronic conditions Nicki Reaper, NP

## 2022-09-13 NOTE — Patient Instructions (Signed)

## 2022-10-20 NOTE — Progress Notes (Unsigned)
PCP:  Lorre Munroe, NP   No chief complaint on file.    HPI:      Kimberly Lloyd is a 32 y.o. [id number] whose LMP was No LMP recorded. (Menstrual status: IUD)., presents today for her annual examination.  Her menses are monthly light spotting for a few days usually but has had random spotting with wiping only the past 6 wks. No dysmen.   Sex activity: not sexually active. No pain/bleeding when was. Mirena REplaced 06/07/20 Last Pap: 08/21/21 Results were no abnormalities/neg HPV DNA; 06/07/20  Results were: low-grade squamous intraepithelial neoplasia (LGSIL - encompassing HPV,mild dysplasia,CIN I) /neg HPV DNA;  05/06/19 ASCUS with pos HPV DNA; CIN 1 on colpo bx 4/21 with Dr. Jean Rosenthal;  Hx of STDs: HPV  There is a FH of breast cancer in her MGGM, genetic testing not indicated for pt. There is no FH of ovarian cancer. The patient does do self-breast exams.  Tobacco use: The patient denies current or previous tobacco use. Alcohol use: none No drug use.  Exercise: moderately active  She does get adequate calcium but not Vitamin D in her diet.  Patient Active Problem List   Diagnosis Date Noted   Class 2 obesity due to excess calories with body mass index (BMI) of 36.0 to 36.9 in adult 11/27/2020   Anxiety 09/30/2019   Herpes simplex 06/09/2019   Headache disorder 05/12/2018   Pseudotumor cerebri 04/21/2018    Past Surgical History:  Procedure Laterality Date   CYST EXCISION     DIAGNOSTIC LAPAROSCOPY     LUMBAR PUNCTURE  04/2018   x3   ORIF ANKLE FRACTURE Left 09/07/2015   Procedure: OPEN REDUCTION INTERNAL FIXATION (ORIF) ANKLE FRACTURE;  Surgeon: Kennedy Bucker, MD;  Location: [facility] OR[facility];  Service: Orthopedics;  Laterality: Left;   TONSILLECTOMY     TONSILLECTOMY AND ADENOIDECTOMY      Family History  Problem Relation Age of Onset   Healthy Mother    Healthy Father    Healthy Brother    Sudden death Brother    Atrial fibrillation Maternal Grandmother    Lung cancer  Paternal Grandmother    Lung cancer Paternal Grandfather    Breast cancer Other        not sure of age    Social History   Socioeconomic History   Marital status: Single    Spouse name: Not on file   Number of children: Not on file   Years of education: Not on file   Highest education level: Bachelor's degree (e.g., BA, AB, BS)  Occupational History   Occupation: Designer, jewellery  Tobacco Use   Smoking status: Never   Smokeless tobacco: Never  Vaping Use   Vaping status: Never Used  Substance and Sexual Activity   Alcohol use: Never    Comment: possibly 1 x per year   Drug use: No   Sexual activity: Not Currently    Birth control/protection: I.U.D.    Comment: Mirena  Other Topics Concern   Not on file  Social History Narrative   Not on file   Social Determinants of Health   Financial Resource Strain: Low Risk  (10/24/2017)   Overall Financial Resource Strain (CARDIA)    Difficulty of Paying Living Expenses: Not hard at all  Food Insecurity: No Food Insecurity (10/24/2017)   Hunger Vital Sign    Worried About Running Out of Food in the Last Year: Never true    Ran Out of Food in  the Last Year: Never true  Transportation Needs: No Transportation Needs (10/24/2017)   PRAPARE - Administrator, Civil Service (Medical): No    Lack of Transportation (Non-Medical): No  Physical Activity: Sufficiently Active (01/16/2017)   Exercise Vital Sign    Days of Exercise per Week: 4 days    Minutes of Exercise per Session: 60 min  Stress: No Stress Concern Present (01/16/2017)   [employer] of Occupational Health - Occupational Stress Questionnaire    Feeling of Stress : Not at all  Social Connections: Moderately Integrated (01/16/2017)   Social Connection and Isolation Panel [NHANES]    Frequency of Communication with Friends and Family: More than three times a week    Frequency of Social Gatherings with Friends and Family: Once a week    Attends Religious  Services: More than 4 times per year    Active Member of [employer] or Organizations: Yes    Attends Engineer, structural: More than 4 times per year    Marital Status: Divorced  Intimate Partner Violence: Not At Risk (01/16/2017)   Humiliation, Afraid, Rape, and Kick questionnaire    Fear of Current or Ex-Partner: No    Emotionally Abused: No    Physically Abused: No    Sexually Abused: No     Current Outpatient Medications:    fluticasone (FLONASE) 50 MCG/ACT nasal spray, Place 2 sprays into both nostrils daily for 7 days., Disp: 16 g, Rfl: 0   levonorgestrel (MIRENA, 52 MG,) 20 MCG/DAY IUD, by Intrauterine route., Disp: , Rfl:    valACYclovir (VALTREX) 1000 MG tablet, Take 1 tablet (1,000 mg total) by mouth 3 (three) times daily. As needed only for cold sore flare for up to 10 day, Disp: 30 tablet, Rfl: 2     ROS:  Review of Systems  Constitutional:  Negative for fatigue, fever and unexpected weight change.  Respiratory:  Negative for cough, shortness of breath and wheezing.   Cardiovascular:  Negative for chest pain, palpitations and leg swelling.  Gastrointestinal:  Negative for blood in stool, constipation, diarrhea, nausea and vomiting.  Endocrine: Negative for cold intolerance, heat intolerance and polyuria.  Genitourinary:  Negative for dyspareunia, dysuria, flank pain, frequency, genital sores, hematuria, menstrual problem, pelvic pain, urgency, vaginal bleeding, vaginal discharge and vaginal pain.  Musculoskeletal:  Negative for back pain, joint swelling and myalgias.  Skin:  Negative for rash.  Neurological:  Negative for dizziness, syncope, light-headedness, numbness and headaches.  Hematological:  Negative for adenopathy.  Psychiatric/Behavioral:  Negative for agitation, confusion, sleep disturbance and suicidal ideas. The patient is not nervous/anxious.    BREAST: No symptoms   Objective: There were no vitals taken for this visit.   Physical  Exam Constitutional:      Appearance: She is well-developed.  Genitourinary:     Vulva normal.     Right Labia: No rash, tenderness or lesions.    Left Labia: No tenderness, lesions or rash.    No vaginal discharge, erythema or tenderness.      Right Adnexa: not tender and no mass present.    Left Adnexa: not tender and no mass present.    No cervical friability or polyp.     IUD strings visualized.     Uterus is not enlarged or tender.  Breasts:    Right: No mass, nipple discharge, skin change or tenderness.     Left: No mass, nipple discharge, skin change or tenderness.  Neck:     Thyroid:  No thyromegaly.  Cardiovascular:     Rate and Rhythm: Normal rate and regular rhythm.     Heart sounds: Normal heart sounds. No murmur heard. Pulmonary:     Effort: Pulmonary effort is normal.     Breath sounds: Normal breath sounds.  Abdominal:     Palpations: Abdomen is soft.     Tenderness: There is no abdominal tenderness. There is no guarding or rebound.  Musculoskeletal:        General: Normal range of motion.     Cervical back: Normal range of motion.  Lymphadenopathy:     Cervical: No cervical adenopathy.  Neurological:     General: No focal deficit present.     Mental Status: She is alert and oriented to person, place, and time.     Cranial Nerves: No cranial nerve deficit.  Skin:    General: Skin is warm and dry.  Psychiatric:        Mood and Affect: Mood normal.        Behavior: Behavior normal.        Thought Content: Thought content normal.        Judgment: Judgment normal.  Vitals reviewed.     Assessment/Plan: Encounter for annual routine gynecological examination  Cervical cancer screening - Plan: Cytology - PAP  Screening for STD (sexually transmitted disease) - Plan: Cervicovaginal ancillary only  Screening for HPV (human papillomavirus) - Plan: Cytology - PAP  Dysplasia of cervix, low grade (CIN 1) - Plan: Cytology - PAP; repeat pap today, will f/u with  results  Encounter for routine checking of intrauterine contraceptive device (IUD)--IUD strings in cx os, has 8 yr indication            GYN counsel adequate intake of calcium and vitamin D, diet and exercise     F/U  No follow-ups on file.  Shenoa Hattabaugh B. Jacoby Zanni, PA-C 10/20/2022 3:51 PM

## 2022-10-21 ENCOUNTER — Ambulatory Visit (INDEPENDENT_AMBULATORY_CARE_PROVIDER_SITE_OTHER): Payer: BC Managed Care – PPO | Admitting: Obstetrics and Gynecology

## 2022-10-21 ENCOUNTER — Other Ambulatory Visit (HOSPITAL_COMMUNITY)
Admission: RE | Admit: 2022-10-21 | Discharge: 2022-10-21 | Disposition: A | Discharge status: Home / Self Care | Payer: BC Managed Care – PPO | Source: Ambulatory Visit | Attending: Obstetrics and Gynecology | Admitting: Obstetrics and Gynecology

## 2022-10-21 ENCOUNTER — Encounter: Payer: Self-pay | Admitting: Obstetrics and Gynecology

## 2022-10-21 VITALS — BP 128/88 | HR 53 | Resp 16 | Ht 65.5 in | Wt 226.8 lb

## 2022-10-21 DIAGNOSIS — N870 Mild cervical dysplasia: Secondary | ICD-10-CM

## 2022-10-21 DIAGNOSIS — Z113 Encounter for screening for infections with a predominantly sexual mode of transmission: Secondary | ICD-10-CM

## 2022-10-21 DIAGNOSIS — Z124 Encounter for screening for malignant neoplasm of cervix: Secondary | ICD-10-CM

## 2022-10-21 DIAGNOSIS — Z01419 Encounter for gynecological examination (general) (routine) without abnormal findings: Secondary | ICD-10-CM

## 2022-10-21 DIAGNOSIS — Z1151 Encounter for screening for human papillomavirus (HPV): Secondary | ICD-10-CM

## 2022-10-21 DIAGNOSIS — Z30431 Encounter for routine checking of intrauterine contraceptive device: Secondary | ICD-10-CM

## 2022-10-21 NOTE — Patient Instructions (Signed)
I value your feedback and you entrusting us with your care. If you get a [facility] patient survey, I would appreciate you taking the time to let us know about your experience today. Thank you! ? ? ?

## 2022-10-22 LAB — CERVICOVAGINAL ANCILLARY ONLY
Chlamydia: NEGATIVE
Comment: NEGATIVE
Comment: NEGATIVE
Comment: NORMAL
Neisseria Gonorrhea: NEGATIVE
Trichomonas: NEGATIVE

## 2022-10-22 LAB — RPR QUALITATIVE: RPR Ser Ql: NONREACTIVE

## 2022-10-22 LAB — HIV ANTIBODY (ROUTINE TESTING W REFLEX): HIV Screen 4th Generation wRfx: NONREACTIVE

## 2022-10-22 LAB — HEPATITIS C ANTIBODY: Hep C Virus Ab: NONREACTIVE
